# Patient Record
Sex: Male | Born: 1940 | Race: Black or African American | Hispanic: No | Smoking: Former smoker
Health system: Southern US, Community
[De-identification: ages and names within clinical notes are randomized; demographics above are authoritative.]

## PROBLEM LIST (undated history)

## (undated) DIAGNOSIS — I50810 Right heart failure, unspecified: Secondary | ICD-10-CM

## (undated) DIAGNOSIS — I272 Pulmonary hypertension, unspecified: Secondary | ICD-10-CM

## (undated) DIAGNOSIS — E785 Hyperlipidemia, unspecified: Secondary | ICD-10-CM

## (undated) DIAGNOSIS — I10 Essential (primary) hypertension: Secondary | ICD-10-CM

## (undated) DIAGNOSIS — E119 Type 2 diabetes mellitus without complications: Secondary | ICD-10-CM

## (undated) DIAGNOSIS — I2510 Atherosclerotic heart disease of native coronary artery without angina pectoris: Secondary | ICD-10-CM

## (undated) DIAGNOSIS — I4891 Unspecified atrial fibrillation: Secondary | ICD-10-CM

## (undated) DIAGNOSIS — Z951 Presence of aortocoronary bypass graft: Secondary | ICD-10-CM

---

## 2006-09-03 HISTORY — PX: CORONARY ARTERY BYPASS GRAFT: SHX141

## 2017-04-17 ENCOUNTER — Encounter (HOSPITAL_COMMUNITY): Payer: Self-pay | Admitting: Cardiology

## 2017-04-17 ENCOUNTER — Inpatient Hospital Stay (HOSPITAL_COMMUNITY)
Admission: AD | Admit: 2017-04-17 | Discharge: 2017-05-04 | DRG: 291 | Disposition: E | Payer: Medicare Other | Source: Other Acute Inpatient Hospital | Attending: Internal Medicine | Admitting: Internal Medicine

## 2017-04-17 ENCOUNTER — Inpatient Hospital Stay (HOSPITAL_COMMUNITY): Payer: Medicare Other

## 2017-04-17 DIAGNOSIS — N179 Acute kidney failure, unspecified: Secondary | ICD-10-CM | POA: Diagnosis present

## 2017-04-17 DIAGNOSIS — I2510 Atherosclerotic heart disease of native coronary artery without angina pectoris: Secondary | ICD-10-CM | POA: Diagnosis present

## 2017-04-17 DIAGNOSIS — Z87891 Personal history of nicotine dependence: Secondary | ICD-10-CM

## 2017-04-17 DIAGNOSIS — E119 Type 2 diabetes mellitus without complications: Secondary | ICD-10-CM

## 2017-04-17 DIAGNOSIS — D72829 Elevated white blood cell count, unspecified: Secondary | ICD-10-CM | POA: Diagnosis not present

## 2017-04-17 DIAGNOSIS — I472 Ventricular tachycardia: Secondary | ICD-10-CM | POA: Diagnosis present

## 2017-04-17 DIAGNOSIS — I482 Chronic atrial fibrillation: Secondary | ICD-10-CM | POA: Diagnosis present

## 2017-04-17 DIAGNOSIS — Z951 Presence of aortocoronary bypass graft: Secondary | ICD-10-CM

## 2017-04-17 DIAGNOSIS — G9340 Encephalopathy, unspecified: Secondary | ICD-10-CM | POA: Diagnosis not present

## 2017-04-17 DIAGNOSIS — E874 Mixed disorder of acid-base balance: Secondary | ICD-10-CM | POA: Diagnosis present

## 2017-04-17 DIAGNOSIS — D689 Coagulation defect, unspecified: Secondary | ICD-10-CM | POA: Diagnosis present

## 2017-04-17 DIAGNOSIS — E662 Morbid (severe) obesity with alveolar hypoventilation: Secondary | ICD-10-CM | POA: Diagnosis present

## 2017-04-17 DIAGNOSIS — I469 Cardiac arrest, cause unspecified: Secondary | ICD-10-CM | POA: Diagnosis not present

## 2017-04-17 DIAGNOSIS — J9601 Acute respiratory failure with hypoxia: Secondary | ICD-10-CM | POA: Diagnosis not present

## 2017-04-17 DIAGNOSIS — D6832 Hemorrhagic disorder due to extrinsic circulating anticoagulants: Secondary | ICD-10-CM | POA: Diagnosis present

## 2017-04-17 DIAGNOSIS — T45515A Adverse effect of anticoagulants, initial encounter: Secondary | ICD-10-CM | POA: Diagnosis present

## 2017-04-17 DIAGNOSIS — I5033 Acute on chronic diastolic (congestive) heart failure: Secondary | ICD-10-CM | POA: Diagnosis present

## 2017-04-17 DIAGNOSIS — I2609 Other pulmonary embolism with acute cor pulmonale: Secondary | ICD-10-CM | POA: Diagnosis not present

## 2017-04-17 DIAGNOSIS — I2781 Cor pulmonale (chronic): Secondary | ICD-10-CM | POA: Diagnosis present

## 2017-04-17 DIAGNOSIS — J9622 Acute and chronic respiratory failure with hypercapnia: Secondary | ICD-10-CM | POA: Diagnosis present

## 2017-04-17 DIAGNOSIS — D638 Anemia in other chronic diseases classified elsewhere: Secondary | ICD-10-CM | POA: Diagnosis present

## 2017-04-17 DIAGNOSIS — Z885 Allergy status to narcotic agent status: Secondary | ICD-10-CM | POA: Diagnosis not present

## 2017-04-17 DIAGNOSIS — N19 Unspecified kidney failure: Secondary | ICD-10-CM

## 2017-04-17 DIAGNOSIS — Z01818 Encounter for other preprocedural examination: Secondary | ICD-10-CM

## 2017-04-17 DIAGNOSIS — I132 Hypertensive heart and chronic kidney disease with heart failure and with stage 5 chronic kidney disease, or end stage renal disease: Secondary | ICD-10-CM | POA: Diagnosis present

## 2017-04-17 DIAGNOSIS — N186 End stage renal disease: Secondary | ICD-10-CM | POA: Diagnosis present

## 2017-04-17 DIAGNOSIS — E875 Hyperkalemia: Secondary | ICD-10-CM | POA: Diagnosis present

## 2017-04-17 DIAGNOSIS — I480 Paroxysmal atrial fibrillation: Secondary | ICD-10-CM | POA: Diagnosis present

## 2017-04-17 DIAGNOSIS — I509 Heart failure, unspecified: Secondary | ICD-10-CM | POA: Diagnosis present

## 2017-04-17 DIAGNOSIS — Z452 Encounter for adjustment and management of vascular access device: Secondary | ICD-10-CM

## 2017-04-17 DIAGNOSIS — E1122 Type 2 diabetes mellitus with diabetic chronic kidney disease: Secondary | ICD-10-CM | POA: Diagnosis present

## 2017-04-17 DIAGNOSIS — Z8249 Family history of ischemic heart disease and other diseases of the circulatory system: Secondary | ICD-10-CM

## 2017-04-17 DIAGNOSIS — Z7901 Long term (current) use of anticoagulants: Secondary | ICD-10-CM

## 2017-04-17 DIAGNOSIS — Z6841 Body Mass Index (BMI) 40.0 and over, adult: Secondary | ICD-10-CM

## 2017-04-17 DIAGNOSIS — I2729 Other secondary pulmonary hypertension: Secondary | ICD-10-CM | POA: Diagnosis present

## 2017-04-17 DIAGNOSIS — J9621 Acute and chronic respiratory failure with hypoxia: Secondary | ICD-10-CM | POA: Diagnosis present

## 2017-04-17 DIAGNOSIS — I50810 Right heart failure, unspecified: Secondary | ICD-10-CM | POA: Diagnosis present

## 2017-04-17 DIAGNOSIS — D696 Thrombocytopenia, unspecified: Secondary | ICD-10-CM | POA: Diagnosis present

## 2017-04-17 DIAGNOSIS — E871 Hypo-osmolality and hyponatremia: Secondary | ICD-10-CM | POA: Diagnosis not present

## 2017-04-17 DIAGNOSIS — Z515 Encounter for palliative care: Secondary | ICD-10-CM | POA: Diagnosis present

## 2017-04-17 DIAGNOSIS — R570 Cardiogenic shock: Secondary | ICD-10-CM | POA: Diagnosis not present

## 2017-04-17 DIAGNOSIS — T80219A Unspecified infection due to central venous catheter, initial encounter: Secondary | ICD-10-CM

## 2017-04-17 DIAGNOSIS — I4891 Unspecified atrial fibrillation: Secondary | ICD-10-CM | POA: Diagnosis present

## 2017-04-17 DIAGNOSIS — E785 Hyperlipidemia, unspecified: Secondary | ICD-10-CM | POA: Diagnosis present

## 2017-04-17 DIAGNOSIS — I361 Nonrheumatic tricuspid (valve) insufficiency: Secondary | ICD-10-CM | POA: Diagnosis not present

## 2017-04-17 DIAGNOSIS — I5084 End stage heart failure: Secondary | ICD-10-CM | POA: Diagnosis present

## 2017-04-17 DIAGNOSIS — Z9981 Dependence on supplemental oxygen: Secondary | ICD-10-CM

## 2017-04-17 DIAGNOSIS — I272 Pulmonary hypertension, unspecified: Secondary | ICD-10-CM | POA: Diagnosis not present

## 2017-04-17 DIAGNOSIS — J449 Chronic obstructive pulmonary disease, unspecified: Secondary | ICD-10-CM | POA: Diagnosis present

## 2017-04-17 DIAGNOSIS — I10 Essential (primary) hypertension: Secondary | ICD-10-CM | POA: Diagnosis present

## 2017-04-17 DIAGNOSIS — I50813 Acute on chronic right heart failure: Secondary | ICD-10-CM | POA: Diagnosis not present

## 2017-04-17 DIAGNOSIS — Z978 Presence of other specified devices: Secondary | ICD-10-CM

## 2017-04-17 HISTORY — DX: Atherosclerotic heart disease of native coronary artery without angina pectoris: I25.10

## 2017-04-17 HISTORY — DX: Presence of aortocoronary bypass graft: Z95.1

## 2017-04-17 HISTORY — DX: Right heart failure, unspecified: I50.810

## 2017-04-17 HISTORY — DX: Hyperlipidemia, unspecified: E78.5

## 2017-04-17 HISTORY — DX: Pulmonary hypertension, unspecified: I27.20

## 2017-04-17 HISTORY — DX: Essential (primary) hypertension: I10

## 2017-04-17 HISTORY — DX: Type 2 diabetes mellitus without complications: E11.9

## 2017-04-17 HISTORY — DX: Unspecified atrial fibrillation: I48.91

## 2017-04-17 LAB — CBC WITH DIFFERENTIAL/PLATELET
BASOS ABS: 0 10*3/uL (ref 0.0–0.1)
BASOS PCT: 0 %
EOS ABS: 0 10*3/uL (ref 0.0–0.7)
EOS PCT: 0 %
HCT: 32.2 % — ABNORMAL LOW (ref 39.0–52.0)
Hemoglobin: 10.3 g/dL — ABNORMAL LOW (ref 13.0–17.0)
Lymphocytes Relative: 13 %
Lymphs Abs: 1.1 10*3/uL (ref 0.7–4.0)
MCH: 27.2 pg (ref 26.0–34.0)
MCHC: 32 g/dL (ref 30.0–36.0)
MCV: 85 fL (ref 78.0–100.0)
MONO ABS: 0.5 10*3/uL (ref 0.1–1.0)
Monocytes Relative: 6 %
Neutro Abs: 6.8 10*3/uL (ref 1.7–7.7)
Neutrophils Relative %: 81 %
PLATELETS: 131 10*3/uL — AB (ref 150–400)
RBC: 3.79 MIL/uL — AB (ref 4.22–5.81)
RDW: 17.1 % — AB (ref 11.5–15.5)
WBC: 8.4 10*3/uL (ref 4.0–10.5)

## 2017-04-17 LAB — POCT I-STAT 3, ART BLOOD GAS (G3+)
ACID-BASE DEFICIT: 7 mmol/L — AB (ref 0.0–2.0)
Bicarbonate: 22.4 mmol/L (ref 20.0–28.0)
O2 SAT: 87 %
Patient temperature: 98.6
TCO2: 24 mmol/L (ref 0–100)
pCO2 arterial: 61.9 mmHg — ABNORMAL HIGH (ref 32.0–48.0)
pH, Arterial: 7.166 — CL (ref 7.350–7.450)
pO2, Arterial: 68 mmHg — ABNORMAL LOW (ref 83.0–108.0)

## 2017-04-17 MED ORDER — ATORVASTATIN CALCIUM 20 MG PO TABS: 20.0000 mg | ORAL_TABLET | Freq: Every day | ORAL | Status: DC

## 2017-04-17 MED ORDER — METOPROLOL SUCCINATE ER 50 MG PO TB24: 50.0000 mg | ORAL_TABLET | Freq: Every day | ORAL | Status: DC

## 2017-04-17 MED ORDER — ONDANSETRON HCL 4 MG/2ML IJ SOLN: 4.0000 mg | Freq: Four times a day (QID) | INTRAMUSCULAR | Status: DC | PRN

## 2017-04-17 MED ORDER — ACETAMINOPHEN 325 MG PO TABS: 650.0000 mg | ORAL_TABLET | ORAL | Status: DC | PRN

## 2017-04-17 MED ORDER — INSULIN ASPART 100 UNIT/ML ~~LOC~~ SOLN: 0.0000 [IU] | Freq: Every day | SUBCUTANEOUS | Status: DC

## 2017-04-17 MED ORDER — INSULIN ASPART 100 UNIT/ML ~~LOC~~ SOLN: 0.0000 [IU] | Freq: Three times a day (TID) | SUBCUTANEOUS | Status: DC

## 2017-04-17 MED ORDER — SODIUM CHLORIDE 0.9 % IV SOLN: 250.0000 mL | INTRAVENOUS | Status: DC | PRN

## 2017-04-17 MED ORDER — DOCUSATE SODIUM 100 MG PO CAPS: 100.0000 mg | ORAL_CAPSULE | Freq: Two times a day (BID) | ORAL | Status: DC | PRN

## 2017-04-17 MED ORDER — SODIUM CHLORIDE 0.9% FLUSH
3.0000 mL | Freq: Two times a day (BID) | INTRAVENOUS | Status: DC
  Administered 2017-04-17 – 2017-04-20 (×6): 3 mL via INTRAVENOUS
  Administered 2017-04-20: 10 mL via INTRAVENOUS
  Administered 2017-04-21: 3 mL via INTRAVENOUS

## 2017-04-17 MED ORDER — SODIUM CHLORIDE 0.9 % IV SOLN
250.0000 mL | INTRAVENOUS | Status: DC | PRN
  Administered 2017-04-18: 10 mL/h via INTRAVENOUS

## 2017-04-17 MED ORDER — INSULIN ASPART 100 UNIT/ML ~~LOC~~ SOLN
2.0000 [IU] | SUBCUTANEOUS | Status: DC
  Administered 2017-04-18: 6 [IU] via SUBCUTANEOUS
  Administered 2017-04-18 (×2): 4 [IU] via SUBCUTANEOUS
  Administered 2017-04-18 – 2017-04-19 (×3): 2 [IU] via SUBCUTANEOUS
  Administered 2017-04-19: 4 [IU] via SUBCUTANEOUS
  Administered 2017-04-19: 2 [IU] via SUBCUTANEOUS
  Administered 2017-04-19: 4 [IU] via SUBCUTANEOUS
  Administered 2017-04-20 (×2): 2 [IU] via SUBCUTANEOUS
  Administered 2017-04-20 (×3): 4 [IU] via SUBCUTANEOUS
  Administered 2017-04-20 – 2017-04-21 (×2): 2 [IU] via SUBCUTANEOUS
  Administered 2017-04-21 (×3): 4 [IU] via SUBCUTANEOUS
  Administered 2017-04-21 – 2017-04-22 (×4): 2 [IU] via SUBCUTANEOUS

## 2017-04-17 MED ORDER — NOREPINEPHRINE BITARTRATE 1 MG/ML IV SOLN
0.0000 ug/min | INTRAVENOUS | Status: DC
  Administered 2017-04-18: 11 ug/min via INTRAVENOUS
  Administered 2017-04-18: 15 ug/min via INTRAVENOUS
  Filled 2017-04-17 (×3): qty 4

## 2017-04-17 MED ORDER — SODIUM CHLORIDE 0.9% FLUSH
3.0000 mL | INTRAVENOUS | Status: DC | PRN
  Administered 2017-04-18: 3 mL via INTRAVENOUS
  Filled 2017-04-17: qty 3

## 2017-04-17 MED ORDER — PANTOPRAZOLE SODIUM 40 MG IV SOLR: 40.0000 mg | INTRAVENOUS | Status: DC

## 2017-04-17 MED ORDER — KETAMINE HCL-SODIUM CHLORIDE 100-0.9 MG/10ML-% IV SOSY
100.0000 mg | PREFILLED_SYRINGE | Freq: Once | INTRAVENOUS | Status: AC
  Administered 2017-04-18: 100 mg via INTRAVENOUS
  Filled 2017-04-17: qty 10

## 2017-04-17 MED ORDER — CALCITRIOL 0.25 MCG PO CAPS: 0.2500 ug | ORAL_CAPSULE | Freq: Every day | ORAL | Status: DC

## 2017-04-17 NOTE — Progress Notes (Signed)
[facility] for heart failure and kidney failure management. Cardiology team is notified. Dr Mayford Knifeurner is on call. Patient is fully oriented, vital signs are within expected range.

## 2017-04-17 NOTE — H&P (Signed)
History and Physical   Patient ID: William Strong, MRN: [id number], DOB: [birth year]   Date of Encounter: 04/16/2017, 9:59 PM  Primary Care Provider: No primary care provider on file. Cardiologist: [place]  Electrophysiologist:  NA  Chief Complaint:  R-sided HF, pulm HTN  History of Present Illness: William Strong is a 76 y.o. male w/ h/o CAD s/p CABG in 2008 at [facility] (LIMA-LAD known to be atretic, SVG-OM1, SVG-PDA), R-sided HF w/ preserved LVEF, pulm HTN (severe on RHC Jan 2018), chronic renal failure, parox afib, HTN, dyslipidemia, DM2 transferred from [place] for worsening HF/renal failure. History is spotty from [place], but as best I can tell, pt was admitted from clinic w/ c/o worsening dyspnea, volume overload. He has R-sided HF w/ preserved LVEF. He was aggressively diuresed in [place] on bumex gtt, and was on milrinone in effort to treat his RV systolic dysfunction, but continued to deteriorate clinically w/ worsening renal failure as well as OHS and progressive hypercapnea and respiratory acidosis. Transferred here for further management Pt is currently on NRB mask. He is in mild resp distress.  Past Medical History:  Diagnosis Date  . A-fib ([place])   . CAD (coronary artery disease)   . Diabetes ([place])   . Dyslipidemia   . HTN (hypertension)   . Hx of CABG   . Pulmonary HTN ([place])   . Right-sided heart failure [facility])     Past Surgical History:  Procedure Laterality Date  . CORONARY ARTERY BYPASS GRAFT  2008   [facility]     Prior to Admission medications   Not on File     Allergies: Allergies  Allergen Reactions  . Oxycontin [Oxycodone Hcl] Shortness Of Breath and Nausea And Vomiting    Reported by patient    Social History:  The patient  reports that he quit smoking about 6 months ago. He does not have any smokeless tobacco history on file. He reports that he drinks alcohol. He reports that he does not use drugs.   Family History:  The patient's family history includes  CAD in his mother; Hypertension in his mother.   ROS:  Please see the history of present illness.     All other systems reviewed and negative.   Vital Signs: There were no vitals taken for this visit.  BP 125/76, HR 96, O2 sat 95% on NRB  PHYSICAL EXAM: General:  Well nourished, well developed, mild resp distress on NRB HEENT: normal Lymph: no adenopathy Neck: neck is thick but JVP appears elevated Endocrine:  No thryomegaly Vascular: No carotid bruits; FA pulses 2+ bilaterally without bruits  Cardiac:  HS obscured by respiratory. No obvious murmur Lungs:  Course BS bilaterally anteriorly Abd: soft, nontender, no hepatomegaly  Ext: warm, well-perfused. 2-3+ bilateral edema Musculoskeletal:  No deformities, BUE and BLE strength normal and equal Skin: warm and dry  Neuro:  CNs 2-12 intact, no focal abnormalities noted Psych:  Normal affect   EKG:  04-15-17 NSR with RBBB, w/ frequent PACs vs afib, difficult to tell. Poor tracing  Labs:  No results found for: WBC, HGB, HCT, MCV, PLT No results for input(s): NA, K, CL, CO2, BUN, CREATININE, CALCIUM, PROT, BILITOT, ALKPHOS, ALT, AST, GLUCOSE in the last 168 hours.  Invalid input(s): LABALBU No results for input(s): CKTOTAL, CKMB, TROPONINI in the last 72 hours. Troponin (Point of Care Test) No results for input(s): TROPIPOC in the last 72 hours.  No results found for: CHOL, HDL, LDLCALC, TRIG No results found for: DDIMER  Radiology/Studies:  No results found.   Labs from [place]: 04/16/2017 138/5.6/104.5/19/100/ Cr 5.2/119 INR 3.23 ABG pH 7.198 pCO2 62, HCO3 24.1, pO2 130.4    TTE 04-16-17 Mildly dilated LV w/o LVH, EF est 55-60%, mildly dilated RV w/ severe RV systolic dysfunction, mildly dilated LA, severely dilated RA, mild MAC w/ mild MR, mild TR, mild pulm HTN w/ est PAS ~22mHg  Nuc stress 09-05-16 showed no significant ischemia; est LVEF 47%  RHC 09-24-16 RA 25/32 mean 246mg RV 8023m w/ EDP 42m94mPA 80/35 with mean  52mm61mCW 11/15 with mean of 10mmH36m 6.5 L/min, CI 2.5, PVR 6.1 Wood units  LHC 01-30-12 LM nl, 50% mLAD, CTO ostial LCx, 80% mRCA Grafts: LIMA-LAD atrentic distally, SVG-OM1 patent, filling OM2 retrograde with ~95% in OM2 (small vessel not amenable for intervention), SVG-PDA patent with LI  ASSESSMENT AND PLAN:   1. R-sided HF/pulm HTN: pt has severe RV dysfunction on recent TTE w/ preserved LVEF. He has not tolerated aggressive diuresis w/ bumex gtt, now w/ progressively worsening renal dysfunction. He did not improve w/ attempt at inotrope gtt. CVVHD is planned to be initiated in the AM. Would hold off on further diuresis via IV for now.  2. CAD: h/o CABG. There do not appear to be any acute issues in regard to active CAD or ischemia at this time. LVEF is preserved  3. Renal dysfunction: worsening; renal to consult tomorrow and initiate CVVHD per notes from OSH.   3. OHS/pulm HTN: will consult CCM for further management of respiratory status  4. Afib: possible afib at OSH. Will get EKG here  Will get basic labs, ABG, EKG. Consult CCM and nephrology. HF svc to see in the AM  Thank you for the opportunity to participate in the care of this very pleasant patient. Will follow. Please call w/ questions.   StephaRudean Curt FACC 0[facility]/18 11:11 PM

## 2017-04-17 NOTE — Consult Note (Signed)
PULMONARY / CRITICAL CARE MEDICINE   Name: William Strong MRN: [id number] DOB: [birth year]    ADMISSION DATE:  05/02/2017 CONSULTATION DATE:  04/12/2017  REFERRING MD:  Dr. Gala Romney   CHIEF COMPLAINT:  Respiratory Distress   HISTORY OF PRESENT ILLNESS:   76 year old male with PMH of A.Fib (on coumadin), OSA on CPAP at HS, Chronic Hypoxia on 4L home oxygen, CKD, CAD s/p CABG, DM, Dyslipidemia, HTN, Pulmonary HTN, and Right-sided heart failure.   Presents to OSH 3 days ago with complainants of progressive dyspnea. Throughout course patient was aggressively diuresed on Bumex gtt and milrinone. However, patient continued to deteriorate with increasing creatinine and BUN and progressive hypercapnia and respiratory acidosis. Was transferred to [facility] for further management. PCCM asked to consult for medial management.   PAST MEDICAL HISTORY :  He  has a past medical history of A-fib (HCC); CAD (coronary artery disease); Diabetes (HCC); Dyslipidemia; HTN (hypertension); CABG; Pulmonary HTN (HCC); and Right-sided heart failure (HCC).  PAST SURGICAL HISTORY: He  has a past surgical history that includes Coronary artery bypass graft (2008).  Allergies  Allergen Reactions  . Oxycontin [Oxycodone Hcl] Shortness Of Breath and Nausea And Vomiting    Reported by patient    No current facility-administered medications on file prior to encounter.    No current outpatient prescriptions on file prior to encounter.    FAMILY HISTORY:  His indicated that his mother is deceased.    SOCIAL HISTORY: He  reports that he quit smoking about 7 months ago. He does not have any smokeless tobacco history on file. He reports that he drinks alcohol. He reports that he does not use drugs.  REVIEW OF SYSTEMS:   Unable to review as patient is encephalopathic   SUBJECTIVE:    VITAL SIGNS: Wt (!) 154.2 kg (340 lb)   HEMODYNAMICS:    VENTILATOR SETTINGS:    INTAKE / OUTPUT: No intake/output data  recorded.  PHYSICAL EXAMINATION: General:  Critical ill male in respiratory distress  Neuro:  Lethargic, responds to physical stimulation, moves extremities   HEENT:  Dry MM  Cardiovascular:  Irregular, no MRG  Lungs:  Crackles to bases, no wheeze, labored  Abdomen:  Obese, active bowel sounds  Musculoskeletal:  +2 BLE edema  Skin:  Warm, dry, intact   LABS:  BMET  Recent Labs Lab 04/25/2017 2322  NA 140  K 6.0*  CL 108  CO2 22  BUN 117*  CREATININE 6.23*  GLUCOSE 149*    Electrolytes  Recent Labs Lab 04/30/2017 2322  CALCIUM 7.1*    CBC  Recent Labs Lab 04/05/2017 2322  WBC 8.4  HGB 10.3*  HCT 32.2*  PLT 131*    Coag's  Recent Labs Lab 04/13/2017 2322  APTT 45*  INR 2.98    Sepsis Markers  Recent Labs Lab 04/16/2017 2322  LATICACIDVEN 0.8    ABG  Recent Labs Lab 04/20/2017 2354  PHART 7.166*  PCO2ART 61.9*  PO2ART 68.0*    Liver Enzymes  Recent Labs Lab 04/19/2017 2322  AST 16  ALT 11*  ALKPHOS 84  BILITOT 0.9  ALBUMIN 3.4*    Cardiac Enzymes No results for input(s): TROPONINI, PROBNP in the last 168 hours.  Glucose No results for input(s): GLUCAP in the last 168 hours.  Imaging Dg Chest Port 1 View  Result Date: 04/25/2017 CLINICAL DATA:  Acute onset of respiratory failure. Hypoxia. Initial encounter. EXAM: PORTABLE CHEST 1 VIEW COMPARISON:  None. FINDINGS: The lungs are well-aerated.  Vascular congestion is noted. Diffusely increased interstitial markings raise concern for pulmonary edema. A small right pleural effusion is suspected. No pneumothorax is seen. The cardiomediastinal silhouette is mildly enlarged. The patient is status post median sternotomy. Evaluation is somewhat suboptimal due to patient rotation. No acute osseous abnormalities are seen. IMPRESSION: Vascular congestion and mild cardiomegaly. Suspect small right pleural effusion. Diffusely increased interstitial markings raise concern for pulmonary edema. Electronically  Signed   By: Roanna RaiderJeffery  Chang M.D.   On: 2016-10-15 23:47     STUDIES:  CXR 8/16 > Vascular congestion and mild cardiomegaly. Suspect small right pleural effusion. Diffusely increased interstitial markings raise concern for pulmonary edema.  CULTURES: None.   ANTIBIOTICS: None.   SIGNIFICANT EVENTS: 8/15 > Presented from OSH   LINES/TUBES: ETT 8/16 >>   DISCUSSION: 76 year old male presents from OSH with progressive right side heart failure complicated by CKD. [facility]   ASSESSMENT / PLAN:  PULMONARY A: Acute on Chronic Hypoxic Respiratory Failure (On home 4L Oxygen) in setting of pulmonary edema  Pulmonary HTN H/O OSA on CPAP  P:   Maintain SPO2 > 92 ABG/CXR now  Pulmonary Hygiene  Intubate  Vent Support  Pulmonary Hygiene  Sildenafil 20 mg q6h  CARDIOVASCULAR A:  Acute on Chronic Diastolic HF  H/O CAD s/p CABG, HTN, HLD  P:  Per Cardiology > Plans to Cath  Cardiac Monitoring  Maintain MAP >65  Hold home HTN medications  Hold Heparin Gtt as INR is >3  RENAL A:   Acute on Chronic Kidney Disease Stage IV Hyperkalemia   - 6.0 >  H/O Urinary Retention  P:   Trend BMP Nephrology consulted and will see in AM  Replace electrolytes as indicated  Kayexalate and Temporization now   GASTROINTESTINAL A:   No issues  P:   NPO PPI   HEMATOLOGIC A:   Anemia of Chronic disease  On Chronic Anticoagulation given PAF Increased INR  P:  Trend CBC  SCDS Maintain Hemoglobin > 7  Plans for Heparin as above   INFECTIOUS A:   No issues  P:   Trend WBC and Fever Curve   ENDOCRINE A:   DM   P:   Trend Glucose  SSI  NEUROLOGIC A:   Acute Encephalopathy Secondary to sedation  P:   RASS goal: -1/-2 Wean Propofol to achieve RASS  Fentanyl PRN     FAMILY  - Updates: Family updated at bedside   - Inter-disciplinary family meet or Palliative Care meeting due by:  04/25/2017    CC Time: 54 minutes   Jovita KussmaulKatalina  Eubanks, AGACNP-BC [facility]  Pgr: [phone]  PCCM Pgr: [phone]   Patient seen and examined with Mrs. Janyth Contesubanks. I agree with the assessment and plan.  76 with Hx of pulmonary hypertension and RV failure presented with progressive dyspnea due to decompensated heart failure and AKI. Patient found to have resp and metabolic acidosis, required intubation.  Acute hypoxic resp failure due to volume overload. On mechanical ventilation. Follow ABGs, High PEEP for hypoxia  Shock: cardiogenic vs septic. Invasive hemodynamic monitor. PAC insertion. Send cultures and start broad abx. Sildenafil for pulmonary hypertension  AKI and hyperkalemia: treat hyperkalemia medically, start diuresis. Consult nephrology  Oswaldo MilianAkram Lilybelle Mayeda, MD CCM attending'

## 2017-04-18 ENCOUNTER — Inpatient Hospital Stay (HOSPITAL_COMMUNITY): Payer: Medicare Other

## 2017-04-18 DIAGNOSIS — J9601 Acute respiratory failure with hypoxia: Secondary | ICD-10-CM

## 2017-04-18 DIAGNOSIS — M7989 Other specified soft tissue disorders: Secondary | ICD-10-CM

## 2017-04-18 DIAGNOSIS — N179 Acute kidney failure, unspecified: Secondary | ICD-10-CM

## 2017-04-18 DIAGNOSIS — I361 Nonrheumatic tricuspid (valve) insufficiency: Secondary | ICD-10-CM

## 2017-04-18 LAB — POCT I-STAT 4, (NA,K, GLUC, HGB,HCT)
GLUCOSE: 160 mg/dL — AB (ref 65–99)
HEMATOCRIT: 30 % — AB (ref 39.0–52.0)
HEMOGLOBIN: 10.2 g/dL — AB (ref 13.0–17.0)
Potassium: 5.6 mmol/L — ABNORMAL HIGH (ref 3.5–5.1)
Sodium: 141 mmol/L (ref 135–145)

## 2017-04-18 LAB — TROPONIN I
Troponin I: 0.05 ng/mL (ref <0.03)
Troponin I: 0.06 ng/mL (ref <0.03)

## 2017-04-18 LAB — BLOOD GAS, ARTERIAL
ACID-BASE DEFICIT: 6.6 mmol/L — AB (ref 0.0–2.0)
BICARBONATE: 20.6 mmol/L (ref 20.0–28.0)
Drawn by: 418751
FIO2: 100
LHR: 22 {breaths}/min
O2 Saturation: 91.9 %
PCO2 ART: 58.8 mmHg — AB (ref 32.0–48.0)
PEEP/CPAP: 14 cmH2O
PH ART: 7.17 — AB (ref 7.350–7.450)
PRESSURE CONTROL: 20 cmH2O
Patient temperature: 98.6
pO2, Arterial: 76.4 mmHg — ABNORMAL LOW (ref 83.0–108.0)

## 2017-04-18 LAB — BASIC METABOLIC PANEL
ANION GAP: 11 (ref 5–15)
ANION GAP: 11 (ref 5–15)
Anion gap: 9 (ref 5–15)
BUN: 116 mg/dL — AB (ref 6–20)
BUN: 116 mg/dL — AB (ref 6–20)
BUN: 115 mg/dL — ABNORMAL HIGH (ref 6–20)
CALCIUM: 7.1 mg/dL — AB (ref 8.9–10.3)
CALCIUM: 7.2 mg/dL — AB (ref 8.9–10.3)
CHLORIDE: 108 mmol/L (ref 101–111)
CHLORIDE: 107 mmol/L (ref 101–111)
CO2: 23 mmol/L (ref 22–32)
CO2: 21 mmol/L — ABNORMAL LOW (ref 22–32)
CO2: 21 mmol/L — ABNORMAL LOW (ref 22–32)
CREATININE: 6.22 mg/dL — AB (ref 0.61–1.24)
CREATININE: 6.2 mg/dL — AB (ref 0.61–1.24)
Calcium: 7 mg/dL — ABNORMAL LOW (ref 8.9–10.3)
Chloride: 107 mmol/L (ref 101–111)
Creatinine, Ser: 6.13 mg/dL — ABNORMAL HIGH (ref 0.61–1.24)
GFR calc Af Amer: 9 mL/min — ABNORMAL LOW (ref >60)
GFR calc Af Amer: 9 mL/min — ABNORMAL LOW (ref >60)
GFR calc non Af Amer: 8 mL/min — ABNORMAL LOW (ref >60)
GFR calc non Af Amer: 8 mL/min — ABNORMAL LOW (ref >60)
GFR, EST AFRICAN AMERICAN: 9 mL/min — AB (ref >60)
GFR, EST NON AFRICAN AMERICAN: 8 mL/min — AB (ref >60)
GLUCOSE: 188 mg/dL — AB (ref 65–99)
GLUCOSE: 172 mg/dL — AB (ref 65–99)
GLUCOSE: 151 mg/dL — AB (ref 65–99)
POTASSIUM: 5.8 mmol/L — AB (ref 3.5–5.1)
Potassium: 6.1 mmol/L — ABNORMAL HIGH (ref 3.5–5.1)
Potassium: 5.6 mmol/L — ABNORMAL HIGH (ref 3.5–5.1)
Sodium: 140 mmol/L (ref 135–145)
Sodium: 139 mmol/L (ref 135–145)
Sodium: 139 mmol/L (ref 135–145)

## 2017-04-18 LAB — COMPREHENSIVE METABOLIC PANEL
ALT: 11 U/L — AB (ref 17–63)
AST: 16 U/L (ref 15–41)
Albumin: 3.4 g/dL — ABNORMAL LOW (ref 3.5–5.0)
Alkaline Phosphatase: 84 U/L (ref 38–126)
Anion gap: 10 (ref 5–15)
BUN: 117 mg/dL — AB (ref 6–20)
CHLORIDE: 108 mmol/L (ref 101–111)
CO2: 22 mmol/L (ref 22–32)
CREATININE: 6.23 mg/dL — AB (ref 0.61–1.24)
Calcium: 7.1 mg/dL — ABNORMAL LOW (ref 8.9–10.3)
GFR calc Af Amer: 9 mL/min — ABNORMAL LOW (ref >60)
GFR, EST NON AFRICAN AMERICAN: 8 mL/min — AB (ref >60)
Glucose, Bld: 149 mg/dL — ABNORMAL HIGH (ref 65–99)
POTASSIUM: 6 mmol/L — AB (ref 3.5–5.1)
SODIUM: 140 mmol/L (ref 135–145)
Total Bilirubin: 0.9 mg/dL (ref 0.3–1.2)
Total Protein: 6.6 g/dL (ref 6.5–8.1)

## 2017-04-18 LAB — POCT I-STAT 3, ART BLOOD GAS (G3+)
ACID-BASE DEFICIT: 4 mmol/L — AB (ref 0.0–2.0)
Acid-base deficit: 4 mmol/L — ABNORMAL HIGH (ref 0.0–2.0)
BICARBONATE: 21.5 mmol/L (ref 20.0–28.0)
BICARBONATE: 21.4 mmol/L (ref 20.0–28.0)
O2 SAT: 94 %
O2 Saturation: 91 %
PCO2 ART: 40.1 mmHg (ref 32.0–48.0)
PO2 ART: 69 mmHg — AB (ref 83.0–108.0)
TCO2: 23 mmol/L (ref 0–100)
TCO2: 23 mmol/L (ref 0–100)
pCO2 arterial: 37.9 mmHg (ref 32.0–48.0)
pH, Arterial: 7.338 — ABNORMAL LOW (ref 7.350–7.450)
pH, Arterial: 7.355 (ref 7.350–7.450)
pO2, Arterial: 65 mmHg — ABNORMAL LOW (ref 83.0–108.0)

## 2017-04-18 LAB — PROTIME-INR
INR: 2.98
PROTHROMBIN TIME: 31.6 s — AB (ref 11.4–15.2)

## 2017-04-18 LAB — COOXEMETRY PANEL
Carboxyhemoglobin: 1 % (ref 0.5–1.5)
METHEMOGLOBIN: 1.5 % (ref 0.0–1.5)
O2 Saturation: 66 %
TOTAL HEMOGLOBIN: 12 g/dL (ref 12.0–16.0)

## 2017-04-18 LAB — GLUCOSE, CAPILLARY
GLUCOSE-CAPILLARY: 212 mg/dL — AB (ref 65–99)
GLUCOSE-CAPILLARY: 181 mg/dL — AB (ref 65–99)
Glucose-Capillary: 129 mg/dL — ABNORMAL HIGH (ref 65–99)
Glucose-Capillary: 91 mg/dL (ref 65–99)
Glucose-Capillary: 104 mg/dL — ABNORMAL HIGH (ref 65–99)
Glucose-Capillary: 162 mg/dL — ABNORMAL HIGH (ref 65–99)

## 2017-04-18 LAB — LACTIC ACID, PLASMA
LACTIC ACID, VENOUS: 0.8 mmol/L (ref 0.5–1.9)
Lactic Acid, Venous: 1 mmol/L (ref 0.5–1.9)

## 2017-04-18 LAB — NA AND K (SODIUM & POTASSIUM), RAND UR
Potassium Urine: 38 mmol/L
Sodium, Ur: 12 mmol/L

## 2017-04-18 LAB — RENAL FUNCTION PANEL
Albumin: 3.1 g/dL — ABNORMAL LOW (ref 3.5–5.0)
Anion gap: 14 (ref 5–15)
BUN: 114 mg/dL — AB (ref 6–20)
CALCIUM: 7.3 mg/dL — AB (ref 8.9–10.3)
CHLORIDE: 106 mmol/L (ref 101–111)
CO2: 20 mmol/L — AB (ref 22–32)
CREATININE: 5.93 mg/dL — AB (ref 0.61–1.24)
GFR calc Af Amer: 10 mL/min — ABNORMAL LOW (ref >60)
GFR calc non Af Amer: 8 mL/min — ABNORMAL LOW (ref >60)
Glucose, Bld: 94 mg/dL (ref 65–99)
Phosphorus: 6 mg/dL — ABNORMAL HIGH (ref 2.5–4.6)
Potassium: 4.5 mmol/L (ref 3.5–5.1)
SODIUM: 140 mmol/L (ref 135–145)

## 2017-04-18 LAB — CG4 I-STAT (LACTIC ACID): Lactic Acid, Venous: 0.55 mmol/L (ref 0.5–1.9)

## 2017-04-18 LAB — OSMOLALITY, URINE: OSMOLALITY UR: 349 mosm/kg (ref 300–900)

## 2017-04-18 LAB — BRAIN NATRIURETIC PEPTIDE: B NATRIURETIC PEPTIDE 5: 296.5 pg/mL — AB (ref 0.0–100.0)

## 2017-04-18 LAB — TRIGLYCERIDES: Triglycerides: 118 mg/dL (ref <150)

## 2017-04-18 LAB — MAGNESIUM: MAGNESIUM: 1.9 mg/dL (ref 1.7–2.4)

## 2017-04-18 LAB — MRSA PCR SCREENING: MRSA by PCR: NEGATIVE

## 2017-04-18 LAB — APTT: APTT: 45 s — AB (ref 24–36)

## 2017-04-18 LAB — CREATININE, URINE, RANDOM: CREATININE, URINE: 280.64 mg/dL

## 2017-04-18 LAB — ECHOCARDIOGRAM COMPLETE
Height: 70 in
WEIGHTICAEL: 5424 [oz_av]

## 2017-04-18 MED ORDER — SODIUM CHLORIDE 0.9 % IV SOLN
1.0000 g | Freq: Once | INTRAVENOUS | Status: AC
  Administered 2017-04-18: 1 g via INTRAVENOUS
  Filled 2017-04-18: qty 10

## 2017-04-18 MED ORDER — BISACODYL 10 MG RE SUPP
10.0000 mg | Freq: Every day | RECTAL | Status: DC | PRN
Start: 2017-04-18 — End: 2017-04-23

## 2017-04-18 MED ORDER — SODIUM POLYSTYRENE SULFONATE 15 GM/60ML PO SUSP
15.0000 g | Freq: Once | ORAL | Status: AC
Start: 2017-04-18 — End: 2017-04-18
  Administered 2017-04-18: 15 g
  Filled 2017-04-18: qty 60

## 2017-04-18 MED ORDER — ORAL CARE MOUTH RINSE
15.0000 mL | OROMUCOSAL | Status: DC
  Administered 2017-04-18 – 2017-04-22 (×41): 15 mL via OROMUCOSAL

## 2017-04-18 MED ORDER — IPRATROPIUM-ALBUTEROL 0.5-2.5 (3) MG/3ML IN SOLN
3.0000 mL | Freq: Four times a day (QID) | RESPIRATORY_TRACT | Status: DC
  Administered 2017-04-18 – 2017-04-22 (×18): 3 mL via RESPIRATORY_TRACT
  Filled 2017-04-18 (×19): qty 3

## 2017-04-18 MED ORDER — SODIUM CHLORIDE 0.9 % FOR CRRT
INTRAVENOUS_CENTRAL | Status: DC | PRN
  Filled 2017-04-18: qty 1000

## 2017-04-18 MED ORDER — MIDAZOLAM HCL 2 MG/2ML IJ SOLN
1.0000 mg | INTRAMUSCULAR | Status: DC | PRN
  Administered 2017-04-19 – 2017-04-22 (×2): 2 mg via INTRAVENOUS
  Filled 2017-04-18 (×3): qty 2
  Filled 2017-04-18: qty 4
  Filled 2017-04-18: qty 2

## 2017-04-18 MED ORDER — SODIUM CHLORIDE 0.9% FLUSH
10.0000 mL | Freq: Two times a day (BID) | INTRAVENOUS | Status: DC
  Administered 2017-04-18: 10 mL

## 2017-04-18 MED ORDER — PRISMASOL BGK 4/2.5 32-4-2.5 MEQ/L IV SOLN
INTRAVENOUS | Status: DC
  Administered 2017-04-18 – 2017-04-21 (×4): via INTRAVENOUS_CENTRAL
  Filled 2017-04-18 (×5): qty 5000

## 2017-04-18 MED ORDER — MAGNESIUM SULFATE 2 GM/50ML IV SOLN
2.0000 g | Freq: Once | INTRAVENOUS | Status: AC
  Administered 2017-04-18: 2 g via INTRAVENOUS
  Filled 2017-04-18: qty 50

## 2017-04-18 MED ORDER — VITAL HIGH PROTEIN PO LIQD
1000.0000 mL | ORAL | Status: DC
  Administered 2017-04-18: 1000 mL
  Administered 2017-04-18 – 2017-04-19 (×2)
  Administered 2017-04-19: 1000 mL
  Administered 2017-04-19 (×2)
  Administered 2017-04-20: 1000 mL
  Administered 2017-04-20 – 2017-04-21 (×3)
  Administered 2017-04-21: 1000 mL
  Administered 2017-04-21 – 2017-04-22 (×5)
  Filled 2017-04-18 (×2): qty 1000

## 2017-04-18 MED ORDER — PRISMASOL BGK 4/2.5 32-4-2.5 MEQ/L IV SOLN
INTRAVENOUS | Status: DC
  Administered 2017-04-18 – 2017-04-22 (×6): via INTRAVENOUS_CENTRAL
  Filled 2017-04-18 (×7): qty 5000

## 2017-04-18 MED ORDER — CHLORHEXIDINE GLUCONATE CLOTH 2 % EX PADS
6.0000 | MEDICATED_PAD | Freq: Every day | CUTANEOUS | Status: DC
  Administered 2017-04-18: 6 via TOPICAL

## 2017-04-18 MED ORDER — INSULIN ASPART 100 UNIT/ML ~~LOC~~ SOLN
10.0000 [IU] | Freq: Once | SUBCUTANEOUS | Status: AC
  Administered 2017-04-18: 10 [IU] via INTRAVENOUS

## 2017-04-18 MED ORDER — FENTANYL CITRATE (PF) 100 MCG/2ML IJ SOLN
25.0000 ug | INTRAMUSCULAR | Status: DC | PRN
  Administered 2017-04-18 – 2017-04-22 (×7): 100 ug via INTRAVENOUS
  Filled 2017-04-18 (×9): qty 2

## 2017-04-18 MED ORDER — PROPOFOL 1000 MG/100ML IV EMUL
0.0000 ug/kg/min | INTRAVENOUS | Status: DC
Start: 2017-04-18 — End: 2017-04-23
  Administered 2017-04-18: 40 ug/kg/min via INTRAVENOUS
  Administered 2017-04-18 (×2): 50 ug/kg/min via INTRAVENOUS
  Administered 2017-04-18 (×2): 40 ug/kg/min via INTRAVENOUS
  Administered 2017-04-18: 50 ug/kg/min via INTRAVENOUS
  Administered 2017-04-18: 40 ug/kg/min via INTRAVENOUS
  Administered 2017-04-19 – 2017-04-22 (×33): 50 ug/kg/min via INTRAVENOUS
  Filled 2017-04-18 (×40): qty 100

## 2017-04-18 MED ORDER — FENTANYL CITRATE (PF) 100 MCG/2ML IJ SOLN: 50.0000 ug | INTRAMUSCULAR | Status: DC | PRN

## 2017-04-18 MED ORDER — PRISMASOL BGK 4/2.5 32-4-2.5 MEQ/L IV SOLN
INTRAVENOUS | Status: DC
  Administered 2017-04-18 – 2017-04-22 (×31): via INTRAVENOUS_CENTRAL
  Filled 2017-04-18 (×47): qty 5000

## 2017-04-18 MED ORDER — SODIUM CHLORIDE 0.9% FLUSH: 10.0000 mL | INTRAVENOUS | Status: DC | PRN

## 2017-04-18 MED ORDER — PRO-STAT SUGAR FREE PO LIQD
60.0000 mL | Freq: Four times a day (QID) | ORAL | Status: DC
  Administered 2017-04-18 – 2017-04-22 (×16): 60 mL
  Filled 2017-04-18 (×16): qty 60

## 2017-04-18 MED ORDER — FENTANYL CITRATE (PF) 100 MCG/2ML IJ SOLN
50.0000 ug | INTRAMUSCULAR | Status: DC | PRN
  Administered 2017-04-18: 50 ug via INTRAVENOUS
  Filled 2017-04-18: qty 2

## 2017-04-18 MED ORDER — SODIUM POLYSTYRENE SULFONATE 15 GM/60ML PO SUSP
15.0000 g | Freq: Once | ORAL | Status: AC
  Administered 2017-04-18: 15 g via ORAL
  Filled 2017-04-18: qty 60

## 2017-04-18 MED ORDER — POLYETHYLENE GLYCOL 3350 17 G PO PACK: 17.0000 g | PACK | Freq: Every day | ORAL | Status: DC

## 2017-04-18 MED ORDER — ORAL CARE MOUTH RINSE
15.0000 mL | Freq: Four times a day (QID) | OROMUCOSAL | Status: DC
  Administered 2017-04-18: 15 mL via OROMUCOSAL

## 2017-04-18 MED ORDER — DEXTROSE 5 % IV SOLN
500.0000 mg | Freq: Once | INTRAVENOUS | Status: AC
  Administered 2017-04-18: 500 mg via INTRAVENOUS
  Filled 2017-04-18: qty 500

## 2017-04-18 MED ORDER — SODIUM BICARBONATE 8.4 % IV SOLN
50.0000 meq | Freq: Once | INTRAVENOUS | Status: AC
  Administered 2017-04-18: 50 meq via INTRAVENOUS
  Filled 2017-04-18: qty 50

## 2017-04-18 MED ORDER — PERFLUTREN LIPID MICROSPHERE
1.0000 mL | INTRAVENOUS | Status: AC | PRN
  Administered 2017-04-18: 2 mL via INTRAVENOUS
  Filled 2017-04-18: qty 10

## 2017-04-18 MED ORDER — SODIUM CHLORIDE 0.9 % IV SOLN
1.0000 g | Freq: Once | INTRAVENOUS | Status: AC
  Administered 2017-04-18: 1 g via INTRAVENOUS
  Filled 2017-04-18 (×2): qty 10

## 2017-04-18 MED ORDER — SILDENAFIL CITRATE 20 MG PO TABS
20.0000 mg | ORAL_TABLET | Freq: Three times a day (TID) | ORAL | Status: DC
  Administered 2017-04-18 – 2017-04-22 (×14): 20 mg via ORAL
  Filled 2017-04-18 (×14): qty 1

## 2017-04-18 MED ORDER — PROPOFOL 1000 MG/100ML IV EMUL
INTRAVENOUS | Status: AC
  Administered 2017-04-18: 10 ug/kg/min via INTRAVENOUS
  Filled 2017-04-18: qty 100

## 2017-04-18 MED ORDER — SODIUM CHLORIDE 0.9 % IV SOLN: INTRAVENOUS | Status: DC | PRN

## 2017-04-18 MED ORDER — PANTOPRAZOLE SODIUM 40 MG IV SOLR
40.0000 mg | INTRAVENOUS | Status: DC
  Administered 2017-04-18: 40 mg via INTRAVENOUS
  Filled 2017-04-18 (×2): qty 40

## 2017-04-18 MED ORDER — SODIUM CHLORIDE 0.9% FLUSH
10.0000 mL | Freq: Two times a day (BID) | INTRAVENOUS | Status: DC
  Administered 2017-04-18 – 2017-04-20 (×3): 10 mL

## 2017-04-18 MED ORDER — HEPARIN SODIUM (PORCINE) 1000 UNIT/ML DIALYSIS
1000.0000 [IU] | INTRAMUSCULAR | Status: DC | PRN
  Administered 2017-04-18: 2800 [IU] via INTRAVENOUS_CENTRAL
  Filled 2017-04-18: qty 6
  Filled 2017-04-18: qty 4
  Filled 2017-04-18: qty 6

## 2017-04-18 MED ORDER — NOREPINEPHRINE BITARTRATE 1 MG/ML IV SOLN
0.0000 ug/min | INTRAVENOUS | Status: DC
  Administered 2017-04-18: 10 ug/min via INTRAVENOUS
  Administered 2017-04-21: 4 ug/min via INTRAVENOUS
  Filled 2017-04-18 (×2): qty 16

## 2017-04-18 MED ORDER — CHLORHEXIDINE GLUCONATE CLOTH 2 % EX PADS: 6.0000 | MEDICATED_PAD | Freq: Every day | CUTANEOUS | Status: DC

## 2017-04-18 MED ORDER — PROPOFOL 1000 MG/100ML IV EMUL
5.0000 ug/kg/min | INTRAVENOUS | Status: DC
  Administered 2017-04-18: 10 ug/kg/min via INTRAVENOUS

## 2017-04-18 MED ORDER — CHLORHEXIDINE GLUCONATE 0.12% ORAL RINSE (MEDLINE KIT)
15.0000 mL | Freq: Two times a day (BID) | OROMUCOSAL | Status: DC
  Administered 2017-04-18 – 2017-04-22 (×9): 15 mL via OROMUCOSAL

## 2017-04-18 MED ORDER — CHLORHEXIDINE GLUCONATE CLOTH 2 % EX PADS
6.0000 | MEDICATED_PAD | Freq: Every day | CUTANEOUS | Status: DC
  Administered 2017-04-19: 6 via TOPICAL

## 2017-04-18 MED ORDER — FUROSEMIDE 10 MG/ML IJ SOLN
100.0000 mg | Freq: Once | INTRAVENOUS | Status: AC
  Administered 2017-04-18: 100 mg via INTRAVENOUS
  Filled 2017-04-18: qty 10

## 2017-04-18 MED ORDER — DEXTROSE 50 % IV SOLN
1.0000 | Freq: Once | INTRAVENOUS | Status: AC
  Administered 2017-04-18: 50 mL via INTRAVENOUS
  Filled 2017-04-18: qty 50

## 2017-04-18 NOTE — Procedures (Signed)
Cental line and PAC insertion  Indication: shock and AKI  Family consented. Complete sterile condition Time out  Rt IJ MAC inserted, PAC floated. CXR ordered and patient tolerated the procedure  Samul Dada, MD CCM attending

## 2017-04-18 NOTE — Progress Notes (Signed)
Noticed pulmonary artery catheter had two insertions sites during dressing change. Catheter exits below initial insertion site and re-enters skin approximately one inch below. Site cleaned with Chloraprep and dressing changed. Dr Jamison NeighborNestor and Dr Gala RomneyBensimhon paged and made aware. Ordered to remove line.

## 2017-04-18 NOTE — Progress Notes (Signed)
PULMONARY / CRITICAL CARE MEDICINE   Name: William Strong MRN: [id number] DOB: Jan 22, 1941    ADMISSION DATE:  02-May-2017   CONSULTATION DATE:  05-02-17  REFERRING MD:  Dr. Gala Romney   CHIEF COMPLAINT:  Respiratory Distress   HISTORY OF PRESENT ILLNESS:  76 y.o. male with past medical history of atrial fibrillation on systemic anticoagulation with Coumadin, OSA on CPAP therapy, chronic hypoxic respiratory failure on 4 L/m, chronic renal failure, CAD s/p CABG, DM, Dyslipidemia, HTN, Pulmonary HTN, and cor pulmonale. He presented to outside hospital with 3 days of progressive dyspnea. Patient aggressively diuresed with Bumex drip and Primacor drip. With continued worsening in clinical status as well as worsening renal function, hypercapnia, and respiratory acidosis patient was transferred to [facility] for further medical management. PCCM asked to consult for respiratory failure.  SUBJECTIVE:  Patient intubated overnight with worsening respiratory failure and encephalopathy.  REVIEW OF SYSTEMS:  Unable to obtain given intubated status.  VITAL SIGNS: BP 97/60   Pulse 100   Temp 98.4 F (36.9 C)   Resp (!) 24   Wt (!) 339 lb (153.8 kg)   SpO2 91%   HEMODYNAMICS: PAP: (48-116)/(29-89) 55/32 CVP:  [14 mmHg-34 mmHg] 21 mmHg CO:  [7.2 L/min-8 L/min] 7.2 L/min CI:  [2.8 L/min/m2-3 L/min/m2] 2.8 L/min/m2  VENTILATOR SETTINGS: Vent Mode: PCV FiO2 (%):  [80 %-100 %] 80 % Set Rate:  [22 bmp-24 bmp] 24 bmp PEEP:  [10 cmH20] 10 cmH20 Plateau Pressure:  [25 cmH20] 25 cmH20  INTAKE / OUTPUT: I/O last 3 completed shifts: In: 907.5 [I.V.:537.5; NG/GT:210; IV Piggyback:160] Out: 170 [Urine:70; Emesis/NG output:100]  PHYSICAL EXAMINATION: General:  No acute distress. Children at bedside.  Integument:  Warm & dry. No rash on exposed skin.  HEENT:  Moist mucus membranes. No scleral injection or icterus. Endotracheal tube in place.  Cardiovascular:  Regular rate.No appreciable JVD.   Pulmonary:  Distant breath sounds. Overall clear with auscultation. Symmetric chest wall rise on ventilator. Abdomen: Soft. Normal bowel sounds. Protuberant. Neurological: Sedated. Pupils symmetric. No spontaneous movements.  LABS:  BMET  Recent Labs Lab 04/18/17 0318 04/18/17 0451 04/18/17 0506 04/18/17 0638  NA 140 139 141 139  K 6.1* 5.8* 5.6* 5.6*  CL 108 107  --  107  CO2 23 21*  --  21*  BUN 116* 116*  --  115*  CREATININE 6.22* 6.13*  --  6.20*  GLUCOSE 188* 172* 160* 151*    Electrolytes  Recent Labs Lab 05-02-2017 2322 04/18/17 0318 04/18/17 0451 04/18/17 0638  CALCIUM 7.1* 7.1* 7.0* 7.2*  MG 1.9  --   --   --     CBC  Recent Labs Lab 2017/05/02 2322 04/18/17 0506  WBC 8.4  --   HGB 10.3* 10.2*  HCT 32.2* 30.0*  PLT 131*  --     Coag's  Recent Labs Lab May 02, 2017 2322  APTT 45*  INR 2.98    Sepsis Markers  Recent Labs Lab 2017/05/02 2322 04/18/17 0319  LATICACIDVEN 0.8 1.0    ABG  Recent Labs Lab May 02, 2017 2354 04/18/17 0253  PHART 7.166* 7.170*  PCO2ART 61.9* 58.8*  PO2ART 68.0* 76.4*    Liver Enzymes  Recent Labs Lab 05-02-2017 2322  AST 16  ALT 11*  ALKPHOS 84  BILITOT 0.9  ALBUMIN 3.4*    Cardiac Enzymes  Recent Labs Lab 04/18/17 0451 04/18/17 0638  TROPONINI 0.05* 0.06*    Glucose  Recent Labs Lab 04/18/17 0216 04/18/17 0336 04/18/17 0808  GLUCAP  212* 181* 129*    Imaging Dg Chest Port 1 View  Result Date: 04/18/2017 CLINICAL DATA:  PICC placement.  Initial encounter. EXAM: PORTABLE CHEST 1 VIEW COMPARISON:  Chest radiograph performed earlier today at 12:34 a.m. FINDINGS: The patient's right IJ Swan-Ganz catheter takes an unusually sharp turn at the level of the right atrium, though it is thought to end overlying the pulmonary outflow tract. It appears to be mildly coiled at the pulmonary outflow tract, and could be retracted approximately 3 cm. The endotracheal tube is seen ending 2-3 cm above the  carina. An enteric tube is noted extending below the diaphragm. Vascular congestion is noted. Increased interstitial markings raise concern for pulmonary edema. No definite pleural effusion or pneumothorax is seen. The cardiomediastinal silhouette is enlarged. The patient is status post median sternotomy. No acute osseous abnormalities are identified. IMPRESSION: 1. Right IJ Swan-Ganz catheter takes an unusually sharp turn at the level of the right atrium, though it is thought to extend overlying the right pulmonary outflow tract. It appears to be mildly coiled at the pulmonary outflow tract, and could be retracted approximately 3 cm, as deemed clinically appropriate. 2. Endotracheal tube noted ending 2-3 cm above the carina. 3. Vascular congestion and cardiomegaly. Increased interstitial markings raise concern for pulmonary edema. These results were called by telephone at the time of interpretation on 04/18/2017 at 3:27 am to [id number], who verbally acknowledged these results. Electronically Signed   By: Roanna RaiderJeffery  Chang M.D.   On: 04/18/2017 03:28   Dg Chest Port 1 View  Result Date: 04/18/2017 CLINICAL DATA:  Intubation EXAM: PORTABLE CHEST 1 VIEW COMPARISON:  04/24/2017 FINDINGS: Lung bases are not included. Interval intubation, tip of the endotracheal tube is about 2.2 cm superior to carina. Post sternotomy changes. Cardiomegaly with central vascular congestion and diffuse edema. No pneumothorax. IMPRESSION: Endotracheal tube tip about 2.2 cm superior to carina. Cardiomegaly with vascular congestion and diffuse edema. Non inclusion of lung bases. Electronically Signed   By: Jasmine PangKim  Fujinaga M.D.   On: 04/18/2017 01:08   Dg Chest Port 1 View  Result Date: 04/21/2017 CLINICAL DATA:  Acute onset of respiratory failure. Hypoxia. Initial encounter. EXAM: PORTABLE CHEST 1 VIEW COMPARISON:  None. FINDINGS: The lungs are well-aerated. Vascular congestion is noted. Diffusely increased interstitial markings  raise concern for pulmonary edema. A small right pleural effusion is suspected. No pneumothorax is seen. The cardiomediastinal silhouette is mildly enlarged. The patient is status post median sternotomy. Evaluation is somewhat suboptimal due to patient rotation. No acute osseous abnormalities are seen. IMPRESSION: Vascular congestion and mild cardiomegaly. Suspect small right pleural effusion. Diffusely increased interstitial markings raise concern for pulmonary edema. Electronically Signed   By: Roanna RaiderJeffery  Chang M.D.   On: 05/01/2017 23:47     STUDIES:  PORT CXR 8/16:  Personally reviewed by me. Endotracheal tube in goodpositon. Enteric feeding tube coursing below the diaphragm. Bilateral hilar fullness. Elevation of left hemidiaphragm. Pulmonary arterial catheter appears slightly coiled.  MICROBIOLOGY: MRSA PCR 8/15:  Negative   ANTIBIOTICS: None.   SIGNIFICANT EVENTS: 8/15 - Transferred from OSH & intubated w/ worsening respiratory status & encephalopathy 8/16 - PA catheter placed early in the day  LINES/TUBES: OETT 8/16 >>> R IJ PA CATHETER 8/16 >>> L RADIAL ART LINE >>> OGT 8/16 >>> FOLEY 8/16 >>> PIV  ASSESSMENT / PLAN:  PULMONARY A: Acute on chronic hypoxic respiratory failure: Multifactorial in the setting of pulmonary edema and pulmonary hypertension. Uses 4 L/m at home. Acute  hypercarbic respiratory failure: Question possible underlying COPD. Pulmonary hypertension: Multifactorial. OSA: On CPAP therapy at home. Possible COPD: Chronic tobacco use. Tobacco use disorder: Quit smoking tobacco 7 months prior to admission.  P:   Continuing full ventilator support  Weaning FiO2 for saturation >92% Repeat portable chest x-ray tomorrow morning Trending ABG every 12 hours starting at 5pm x2  Continuing Revatio 20 mg every 6 hours Starting Duonebs every 6 hours Repeat ABG in 30 minutes  CARDIOVASCULAR A:  Acute on chronic congestive heart failure: History of diastolic  congestive heart failure.  H/O CAD s/p CABG, HTN, HLD, paroxysmal atrial fibrillation   P:  Weaning Vasopressor support Continuing PA Catheter w/ monitoring per Heart Failure Service Ordering Complete Echocardiogram Continuous telemetry monitoring Heparin drip per pharmacy protocol Holding home antihypertensive regimen Goal MAP >65 Heart failure service following & managing Checking LLE Venous Duplex with swelling  RENAL A:   Acute on chronic renal failure stage IV: Multifactorial. Known history of urinary retention. Hyperkalemia: Mild. Improving. S/P Kayexalate. History of urinary retention  Metabolic acidosis: Mild.  P:   Nephrology consulted & recommendations Trending electrolytes and renal function daily Monitoring urine output with Foley catheter  Renal U/S pending   GASTROINTESTINAL A:   No acute issues.   P:   Nothing by mouth   HEMATOLOGIC A:   Anemia: Likely secondary to chronic disease. No evidence of active bleeding. Coagulopathy: Secondary to chronic anticoagulation with Coumadin.  Thrombocytopenia: Mild.  P:  Trending cell counts daily with CBC Plan to transfuse for hemoglobin <7.0 or active bleeding   INFECTIOUS A:   No acute issues.  P:   Monitoring for signs/symptoms of infection.  ENDOCRINE A:   DM:  Glucose controlled.  P:   Accu-Cheks every 4 hours Sliding-scale insulin per standard scale Checking hemoglobin A1c  NEUROLOGIC A:   Acute encephalopathy: Likely multifactorial from hypoxia and hypercarbia. Sedation on ventilator   P:   RASS goal: 0 to -1 Propofol infusion titrating Fentanyl IV when necessary pain  Versed IV when necessary sedation   Prophylaxis:  Heparin drip per pharmacy protocol. Protonix IV daily.  Diet:  NPO. Dietician consulted for tube feedings. Code Status:  Full Code per previous physician discussions. Disposition:  Remains critically ill in the ICU. Family Update: Son and daughter updated at length at  bedside by me.  DISCUSSION:  76 y.o. male with multiple medical problems including chronic renal failure, diastolic congestive heart failure, and chronic hypoxic respiratory failure. Prognosis is poor. Discussed at length with patient's son and daughter at bedside. Plan for dialysis access placement after seen by nephrology.  I have spent a total of 33 minutes of critical care time today caring for the patient, updating family at bedside, and reviewing the patient's electronic medical record.   Donna Christen Jamison Neighbor, M.D. [facility] Pager:  [phone] After 3pm or if no response, call [phone] 9:09 AM 04/18/17

## 2017-04-18 NOTE — Procedures (Signed)
Intubation:  Indication: acute hypoxic and hypercapnic resp failure  Time out, meds: ketamine Patient positioned. DL 1st attempt, size 8 ETT Position conformed by CXR, CO2 detector and auscultation  Patient tolerated the procedure  Oswaldo MilianAkram Duncan Alejandro, MD CCM attending

## 2017-04-18 NOTE — Progress Notes (Signed)
Called MD Kasa regarding patients ABG and radiology report. RT to call MD. And Kasa to assess the x-ray. Will continue to monitor patient.  Horton ChinMacKayla A Shamiracle Gorden, RN

## 2017-04-18 NOTE — Progress Notes (Signed)
Swan catheter repositioned by Dr. Gala RomneyBensimhon at the bedside. Chest x-ray ordered for placement confirmation.

## 2017-04-18 NOTE — Care Management Note (Signed)
Case Management Note  Patient Details  Name: William Strong MRN: [id number] Date of Birth: 1940-09-08  Subjective/Objective:   From home, presents from OSH with progressive right side heart failure complicated by CKD. [facility]. Intubated on vent on levophed and driprovan.               Action/Plan: NCM will follow for dc needs.   Expected Discharge Date:                  Expected Discharge Plan:     In-House Referral:     Discharge planning Services  CM Consult  Post Acute Care Choice:    Choice offered to:     DME Arranged:    DME Agency:     HH Arranged:    HH Agency:     Status of Service:  In process, will continue to follow  If discussed at Long Length of Stay Meetings, dates discussed:    Additional Comments:  Leone Havenaylor, Neya Creegan Clinton, RN 04/18/2017, 10:39 AM

## 2017-04-18 NOTE — Consult Note (Signed)
[facility] Consultation Note Requesting Physician:  Bensimhon, MD Primary Nephrologist: Unknown Reason for Consult:    HPI: The patient is a 76 y.o. 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 He has Mhx significant for chronic hypoxic respiratory failure (on 4L o2 at home), pHTN, known right heart failure, OSA on CPAP, CAD s/p CABG, HTN, HLD, CKD stage 4, DM2, chronic anemia and PAF. History spotty, however he was admitted from clinic with complaint of worsening DOE and volume overload. He was diuresed aggressively in OSH w/ Bumex ggt and was on milrinone for RV systolic dysfunction however he continued to deteriorate as manifested by worsening renal failure and hypercapnia with respiratory acidosis. He was subsequently transferred to [facility] for further treatment.   Since arrival he has had worsening respiratory status requiring intubation. He failed trial of high-dose lasix. He remains on ventilator, sedated with propofol and on levophed for pressure support.   Nephrology has been asked to evaluate due to acute renal failure, volume overload and worsening right heart failure/respiratory failure despite aggressive IV diuresis. He has had minimal urine output. On arrival to [facility] he was hyperkalemic (6) with elevated BUN and Cr (117 and 6.23 respectively). No leukocytosis. ABG with pH 7.16, pCO2 62 and O2 of 68.  Creatinine, Ser  Date/Time Value Ref Range Status  04/18/2017 06:38 AM 6.20 (H) 0.61 - 1.24 mg/dL Final  04/18/2017 04:51 AM 6.13 (H) 0.61 - 1.24 mg/dL Final  04/18/2017 03:18 AM 6.22 (H) 0.61 - 1.24 mg/dL Final  05/03/2017 11:22 PM 6.23 (H) 0.61 - 1.24 mg/dL Final   Past Medical History:  Past Medical History:  Diagnosis Date  . A-fib ([place])   . CAD (coronary artery disease)   . Diabetes ([place])   . Dyslipidemia   . HTN  (hypertension)   . Hx of CABG   . Pulmonary HTN ([place])   . Right-sided heart failure [facility])     Past Surgical History:  Past Surgical History:  Procedure Laterality Date  . CORONARY ARTERY BYPASS GRAFT  2008   [facility]    Family History:  Family History  Problem Relation Age of Onset  . CAD Mother   . Hypertension Mother    Social History:  reports that he quit smoking about 7 months ago. He does not have any smokeless tobacco history on file. He reports that he drinks alcohol. He reports that he does not use drugs.  Allergies:  Allergies  Allergen Reactions  . Oxycontin [Oxycodone Hcl] Shortness Of Breath, Nausea And Vomiting and Other (See Comments)    Reported by patient    Home medications: Prior to Admission medications   Medication Sig Start Date End Date Taking? Authorizing Provider  acetaminophen (TYLENOL) 325 MG tablet Take 650 mg by mouth every 4 (four) hours as needed for moderate pain or headache.   Yes [provider]  allopurinol (ZYLOPRIM) 300 MG tablet Take 300 mg by mouth every Monday, Wednesday, and Friday.   Yes [provider]  aspirin EC 81 MG tablet Take 81 mg by mouth daily.   Yes [provider]  atorvastatin (LIPITOR) 20 MG tablet Take 20 mg by mouth daily.   Yes [provider]  B Complex-C-Biotin-D-Zinc-FA (VITAL-D RX PO) Take 1 tablet by mouth daily.   Yes [provider]  benzonatate (TESSALON) 100 MG capsule Take 100 mg  by mouth every 8 (eight) hours as needed for cough.   Yes [provider]  bisacodyl (DULCOLAX) 10 MG suppository Place 10 mg rectally daily as needed for moderate constipation.   Yes [provider]  calcitRIOL (ROCALTROL) 0.25 MCG capsule Take 0.25 mcg by mouth daily.   Yes [provider]  calcium acetate (PHOSLO) 667 MG capsule Take 1,334 mg by mouth 3 (three) times daily with meals.   Yes [provider]  docusate sodium (COLACE) 100 MG capsule Take 100  mg by mouth 2 (two) times daily.   Yes [provider]  insulin detemir (LEVEMIR) 100 UNIT/ML injection Inject 15 Units into the skin at bedtime.   Yes [provider]  insulin lispro (HUMALOG) 100 UNIT/ML injection Inject 2-12 Units into the skin 4 (four) times daily. Per sliding scale   Yes [provider]  LORazepam (ATIVAN) 0.5 MG tablet Take 0.5 mg by mouth 2 (two) times daily as needed for anxiety.   Yes [provider]  magnesium hydroxide (MILK OF MAGNESIA) 400 MG/5ML suspension Take 30 mLs by mouth every 6 (six) hours as needed for mild constipation.   Yes [provider]  metoprolol tartrate (LOPRESSOR) 25 MG tablet Take 25 mg by mouth 2 (two) times daily.   Yes [provider]  polyethylene glycol (MIRALAX / GLYCOLAX) packet Take 17 g by mouth daily.   Yes [provider]  sildenafil (REVATIO) 20 MG tablet Take 80 mg by mouth 3 (three) times daily.   Yes [provider]  tamsulosin (FLOMAX) 0.4 MG CAPS capsule Take 0.4 mg by mouth daily.   Yes [provider]  warfarin (COUMADIN) 5 MG tablet Take 5-7.5 mg by mouth See admin instructions. Take 7.5 mg by mouth only on Tuesday and take 5 mg by mouth daily on all other days.   Yes [provider]   Inpatient medications: . chlorhexidine gluconate (MEDLINE KIT)  15 mL Mouth Rinse BID  . Chlorhexidine Gluconate Cloth  6 each Topical Daily  . insulin aspart  2-6 Units Subcutaneous Q4H  . ipratropium-albuterol  3 mL Nebulization Q6H  . mouth rinse  15 mL Mouth Rinse 10 times per day  . pantoprazole (PROTONIX) IV  40 mg Intravenous Q24H  . sildenafil  20 mg Oral Q8H  . sodium chloride flush  10-40 mL Intracatheter Q12H  . sodium chloride flush  3 mL Intravenous Q12H  Continuous Medications: . sodium chloride 10 mL/hr (04/18/17 0831)  . sodium chloride    . sodium chloride    . norepinephrine (LEVOPHED) Adult infusion 10 mcg/min (04/18/17 1300)  .  dialysis replacement fluid (prismasate)    . dialysis replacement fluid (prismasate)    . dialysate (PRISMASATE)    . propofol (DIPRIVAN) infusion 35 mcg/kg/min (04/18/17 1308)  . sodium chloride     Review of Systems: patient sedated on vent   Physical Exam:  Blood pressure 126/66, pulse (!) 103, temperature 98.4 F (36.9 C), resp. rate (!) 28, height _0  (1.778 m), weight (!) 339 lb (153.8 kg), SpO2 96 %.  Gen: Morbidly obese male. Sedated. On vent. Lines/tubes: ETT, OG, PIV x2. Left radial A-line. Gordy Councilman (RIJ) Skin: no rash, cyanosis Neck: +JVD. Thick neck. Chest: No obvious murmur. Tachycardic. Abdomen: soft, not distended. Ext: Warm and perfused. 2-3+ BL LE edema.  Neuro: RAAS -3. Does not follow commands. PEERL but sluggish.   Labs: Renal Panel:  Recent Labs Lab 04/28/2017 2322 04/18/17 0318 04/18/17 0451  04/18/17 0506 04/18/17 0638  NA 140 140 139 141 139  K 6.0* 6.1* 5.8* 5.6* 5.6*  CL 108 108 107  --  107  CO2 22 23 21*  --  21*  GLUCOSE 149* 188* 172* 160* 151*  BUN 117* 116* 116*  --  115*  CREATININE 6.23* 6.22* 6.13*  --  6.20*  CALCIUM 7.1* 7.1* 7.0*  --  7.2*   Liver Function Tests:  Recent Labs Lab 04/21/2017 2322  AST 16  ALT 11*  ALKPHOS 84  BILITOT 0.9  PROT 6.6  ALBUMIN 3.4*   No results for input(s): LIPASE, AMYLASE in the last 168 hours. No results for input(s): AMMONIA in the last 168 hours. CBC:  Recent Labs Lab 04/19/2017 2322 04/18/17 0506  WBC 8.4  --   NEUTROABS 6.8  --   HGB 10.3* 10.2*  HCT 32.2* 30.0*  MCV 85.0  --   PLT 131*  --    PT/INR: _0 (inr:5) Cardiac Enzymes: ) Recent Labs Lab 04/18/17 0451 04/18/17 0638  TROPONINI 0.05* 0.06*   CBG:  Recent Labs Lab 04/18/17 0216 04/18/17 0336 04/18/17 0808  GLUCAP 212* 181* 129*    Iron Studies: No results for input(s): IRON, TIBC, TRANSFERRIN, FERRITIN in the last 168 hours.  Xrays/Other Studies:  ECHO 04/16/17 ([place]) with LVEF 55-60%, mildly  dilated RV w/ severe RV systolic dysfunction, mildly dilated LA and severely dilated RA with mild MAC w/ mild MR, TR and mild pulmonary HTN with estimated PAS 43 mmHg.   Dg Chest Port 1 View  Result Date: 04/18/2017 CLINICAL DATA:  Acute respiratory failure, hypoxia EXAM: PORTABLE CHEST 1 VIEW COMPARISON:  04/18/2017 FINDINGS: Endotracheal tube and NG tube are stable. Repositioning of the Swan-Ganz catheter with the tip in the right lower lobe pulmonary artery. Cardiomegaly with vascular congestion and mild pulmonary edema, stable. Possible small layering effusions. IMPRESSION: No significant change in the CHF pattern. Suspect small layering effusions. Electronically Signed   By: Rolm Baptise M.D.   On: 04/18/2017 11:02   Dg Chest Port 1 View  Result Date: 04/18/2017 CLINICAL DATA:  PICC placement.  Initial encounter. EXAM: PORTABLE CHEST 1 VIEW COMPARISON:  Chest radiograph performed earlier today at 12:34 a.m. FINDINGS: The patient's right IJ Swan-Ganz catheter takes an unusually sharp turn at the level of the right atrium, though it is thought to end overlying the pulmonary outflow tract. It appears to be mildly coiled at the pulmonary outflow tract, and could be retracted approximately 3 cm. The endotracheal tube is seen ending 2-3 cm above the carina. An enteric tube is noted extending below the diaphragm. Vascular congestion is noted. Increased interstitial markings raise concern for pulmonary edema. No definite pleural effusion or pneumothorax is seen. The cardiomediastinal silhouette is enlarged. The patient is status post median sternotomy. No acute osseous abnormalities are identified. IMPRESSION: 1. Right IJ Swan-Ganz catheter takes an unusually sharp turn at the level of the right atrium, though it is thought to extend overlying the right pulmonary outflow tract. It appears to be mildly coiled at the pulmonary outflow tract, and could be retracted approximately 3 cm, as deemed clinically  appropriate. 2. Endotracheal tube noted ending 2-3 cm above the carina. 3. Vascular congestion and cardiomegaly. Increased interstitial markings raise concern for pulmonary edema. These results were called by telephone at the time of interpretation on 04/18/2017 at 3:27 am to Nursing on [facility], who verbally acknowledged these results. Electronically Signed   By: Garald Balding M.D.   On:  04/18/2017 03:28   Dg Chest Port 1 View  Result Date: 04/18/2017 CLINICAL DATA:  Intubation EXAM: PORTABLE CHEST 1 VIEW COMPARISON:  04/23/2017 FINDINGS: Lung bases are not included. Interval intubation, tip of the endotracheal tube is about 2.2 cm superior to carina. Post sternotomy changes. Cardiomegaly with central vascular congestion and diffuse edema. No pneumothorax. IMPRESSION: Endotracheal tube tip about 2.2 cm superior to carina. Cardiomegaly with vascular congestion and diffuse edema. Non inclusion of lung bases. Electronically Signed   By: Donavan Foil M.D.   On: 04/18/2017 01:08   Dg Chest Port 1 View  Result Date: 05/03/2017 CLINICAL DATA:  Acute onset of respiratory failure. Hypoxia. Initial encounter. EXAM: PORTABLE CHEST 1 VIEW COMPARISON:  None. FINDINGS: The lungs are well-aerated. Vascular congestion is noted. Diffusely increased interstitial markings raise concern for pulmonary edema. A small right pleural effusion is suspected. No pneumothorax is seen. The cardiomediastinal silhouette is mildly enlarged. The patient is status post median sternotomy. Evaluation is somewhat suboptimal due to patient rotation. No acute osseous abnormalities are seen. IMPRESSION: Vascular congestion and mild cardiomegaly. Suspect small right pleural effusion. Diffusely increased interstitial markings raise concern for pulmonary edema. Electronically Signed   By: Garald Balding M.D.   On: 04/09/2017 23:47   Background: This is a critically-ill 76 y/o M here from outside hospital with right heart failure, acute (oliguric) on  chronic renal failure, acute on chronic respiratory failure and massive volume overload. He did not respond well to aggressive IV diuresis. CRRT initiated 04/18/17.  Assessment/Recommendations  1. Acute on Chronic Renal Failure, Cardiorenal syndrome: CKD3-4 at BL. Does have hx of DM2 and HTN. Presents here with oliguric renal failure most likely secondary to cardiorenal syndrome/shock. Cr 6.23 on admission and hyperkalemic to 6.1. He is massively volume overloaded and did not respond as desired to high-dose IV diuresis. Given this, and his significant right heart failure, patient will be started on CRRT today.  1. CRRT 2. I&O's, daily weights 3. Daily renal function panels, BID 2. Massive Right Heart Failure, preserved LVEF: Severe RV dysfunction noted on recent echo. Did not respond to aggressive diureses at outside hospital. HF on, will need RHC once volume status improved.  3. Acute on Chronic Respiratory Failure, pHTN: On 4L O2 via [place] at home at baseline. Currently intubated on full vent support. Worsening resp status no doubt secondary to volume status in setting of his chronic respiratory failure 1. CCM on, appreciate 2. Vent 3. Sildenafil 1m TID 4. Hypotension/Shock: Cardiogenic. On levophed currently.  5. Hyperkalemia: 6.1 max this morning, now 5.6 this morning after Kayexalate 325m Received calcium gluconate. Remainder of lytes OK so far. 1. Monitor renal function closely with daily renal function panels  6. CAD with hx of CABG: Doesn't appear to be active CAD/ischemia currently. Monitor per cards 7. Atrial Fibrillation: ?On warfarin at home?  1. Will follow on telemetry BeEinar GipDO [facility]  04/18/2017, 11:51 AM

## 2017-04-18 NOTE — Progress Notes (Signed)
  Echocardiogram 2D Echocardiogram with Definity has been performed.  Nolon RodBrown, Tony 04/18/2017, 4:06 PM

## 2017-04-18 NOTE — Progress Notes (Signed)
Preliminary results by tech - Venous Duplex Left Lower Ext. Completed. Negative for deep vein thrombosis. Marilynne Halstedita Yaritsa Savarino, BS, RDMS, RVT

## 2017-04-18 NOTE — Progress Notes (Signed)
eLink Physician-Brief Progress Note Patient Name: William Strong DOB: 12/10/1940 MRN: [id number]   Date of Service  04/18/2017  HPI/Events of Note  K trending downwards 6.1-->5.8-->5.6  eICU Interventions  No further interventions at this time Follow up chem 7 by primary team     Intervention Category Evaluation Type: Other  Erin FullingKurian Audie Stayer 04/18/2017, 6:26 AM

## 2017-04-18 NOTE — Progress Notes (Signed)
Called K results from istat to Jovita KussmaulKatalina Eubanks, NP. Also notified of application of bair-hugger for patient temp 35.9. No new orders at this time. Will continue to monitor.  Horton ChinMacKayla A Marinda Tyer, RN

## 2017-04-18 NOTE — Progress Notes (Signed)
Initial Nutrition Assessment  DOCUMENTATION CODES:   Morbid obesity  INTERVENTION:    Initiate Vital High Protein at goal rate of 20 ml/h (240 ml per day) and Prostat 60 ml QID   TF regimen + Propofol to provide total of 2256 kcals, 162 gm protein, 401 ml free water daily  NUTRITION DIAGNOSIS:   Inadequate oral intake related to inability to eat as evidenced by NPO status  GOAL:   Patient will meet greater than or equal to 90% of their needs  MONITOR:   Vent status, TF tolerance, Labs, Weight trends, Skin, I & O's  REASON FOR ASSESSMENT:   Consult Enteral/tube feeding initiation and management  ASSESSMENT:   76 y.o. Male w/ h/o CAD s/p CABG in 2008 at [facility] (LIMA-LAD known to be atretic, SVG-OM1, SVG-PDA), R-sided HF w/ preserved LVEF, pulm HTN (severe on RHC Jan 2018), chronic renal failure, parox afib, HTN, dyslipidemia, DM2 [facility] HF/renal failure.  Patient is currently intubated on ventilator >> OGT in place Temp (24hrs), Avg:97.6 F (36.4 C), Min:96.6 F (35.9 C), Max:98.6 F (37 C)  Propofol: 37 ml/hr >> 976 lipid kcals    Pt intubated this AM due to acute hypoxic and hypercapnic respiratory failure. PCCM note reviewed. Pt with chronic tobacco use. Possible underlying COPD. Acute on chronic renal failure. Nephrology consulted. Labs and medications reviewed. K 5.6 (H). CBG's 212-181-129.  Unable to complete Nutrition-Focused physical exam at this time.   Diet Order:  Diet NPO time specified  Skin:  Reviewed, no issues  Last BM:  PTA  Height:   Ht Readings from Last 1 Encounters:  04/18/17 5\' 10"  (1.778 m)   Weight:   Wt Readings from Last 1 Encounters:  04/18/17 (!) 339 lb (153.8 kg)   Ideal Body Weight:  75.4 kg  BMI:  Body mass index is 48.64 kg/m.  Estimated Nutritional Needs:   Kcal:  [id number]  Protein:  >/= 150 gm  Fluid:  per MD  EDUCATION NEEDS:   Education needs no appropriate at  this time  Maureen ChattersKatie Royalty Fakhouri, RD, LDN Pager #: [phone] After-Hours Pager #: [phone]

## 2017-04-18 NOTE — Progress Notes (Addendum)
Advanced Heart Failure Rounding Note  Primary Cardiologist: [facility] HF: NEW (Dr. Haroldine Laws)   Subjective:    Transferred from [place], [place] yesterday with R sided HF, pulm HTN, ARF and acute respiratory failure.   Intubated last night with ph 7.1. Now sedated on vent.   Swan numbers    CVP 20 PA 57/34  PCW 16 CO 7.0 CI 2.7  Echo 04/16/17 (in [place]) - Mildly dilated LV w/o LVH, EF est 55-60%, mildly dilated RV w/ severe RV systolic dysfunction, mildly dilated LA, severely dilated RA, mild MAC w/ mild MR, mild TR, mild pulm HTN w/ est PAS ~52mHg  Objective:   Weight Range: (!) 339 lb (153.8 kg) There is no height or weight on file to calculate BMI.   Vital Signs:   Temp:  [96.6 F (35.9 C)-98.6 F (37 C)] 98.6 F (37 C) (08/16 0930) Pulse Rate:  [87-101] 101 (08/16 0930) Resp:  [16-28] 25 (08/16 0930) BP: (95-126)/(56-77) 126/66 (08/16 0930) SpO2:  [89 %-95 %] 92 % (08/16 0930) FiO2 (%):  [80 %-100 %] 80 % (08/16 0800) Weight:  [339 lb (153.8 kg)-340 lb (154.2 kg)] 339 lb (153.8 kg) (08/16 0430) Last BM Date:  (pta)  Weight change: Filed Weights   04/18/17 0000 04/18/17 0430  Weight: (!) 340 lb (154.2 kg) (!) 339 lb (153.8 kg)    Intake/Output:   Intake/Output Summary (Last 24 hours) at 04/18/17 0938 Last data filed at 04/18/17 0900  Gross per 24 hour  Intake           1037.9 ml  Output              330 ml  Net            707.9 ml      Physical Exam    General:  Well appearing. No resp difficulty HEENT: Normal Neck: Supple. JVP . Carotids 2+ bilat; no bruits. No lymphadenopathy or thyromegaly appreciated. Cor: PMI nondisplaced. Regular rate & rhythm. No rubs, gallops or murmurs. Lungs: Clear Abdomen: Soft, nontender, nondistended. No hepatosplenomegaly. No bruits or masses. Good bowel sounds. Extremities: No cyanosis, clubbing, rash, edema Neuro: Alert & orientedx3, cranial nerves grossly intact. moves all 4 extremities w/o  difficulty. Affect pleasant   Telemetry   NSR Personally reviewed   EKG    Probable NSR with RBBB. Low volts Personally reviewed   Labs    CBC  Recent Labs  04/09/2017 2322 04/18/17 0506  WBC 8.4  --   NEUTROABS 6.8  --   HGB 10.3* 10.2*  HCT 32.2* 30.0*  MCV 85.0  --   PLT 131*  --    Basic Metabolic Panel  Recent Labs  04/15/2017 2322  04/18/17 0451 04/18/17 0506 04/18/17 0638  NA 140  < > 139 141 139  K 6.0*  < > 5.8* 5.6* 5.6*  CL 108  < > 107  --  107  CO2 22  < > 21*  --  21*  GLUCOSE 149*  < > 172* 160* 151*  BUN 117*  < > 116*  --  115*  CREATININE 6.23*  < > 6.13*  --  6.20*  CALCIUM 7.1*  < > 7.0*  --  7.2*  MG 1.9  --   --   --   --   < > = values in this interval not displayed. Liver Function Tests  Recent Labs  04/10/2017 2322  AST 16  ALT 11*  ALKPHOS 84  BILITOT 0.9  PROT 6.6  ALBUMIN 3.4*   No results for input(s): LIPASE, AMYLASE in the last 72 hours. Cardiac Enzymes  Recent Labs  04/18/17 0451 04/18/17 0638  TROPONINI 0.05* 0.06*    BNP: BNP (last 3 results)  Recent Labs  04/15/2017 2322  BNP 296.5*    ProBNP (last 3 results) No results for input(s): PROBNP in the last 8760 hours.   D-Dimer No results for input(s): DDIMER in the last 72 hours. Hemoglobin A1C No results for input(s): HGBA1C in the last 72 hours. Fasting Lipid Panel  Recent Labs  04/18/17 0317  TRIG 118   Thyroid Function Tests No results for input(s): TSH, T4TOTAL, T3FREE, THYROIDAB in the last 72 hours.  Invalid input(s): FREET3  Other results:   Imaging    Dg Chest Port 1 View  Result Date: 04/18/2017 CLINICAL DATA:  PICC placement.  Initial encounter. EXAM: PORTABLE CHEST 1 VIEW COMPARISON:  Chest radiograph performed earlier today at 12:34 a.m. FINDINGS: The patient's right IJ Swan-Ganz catheter takes an unusually sharp turn at the level of the right atrium, though it is thought to end overlying the pulmonary outflow tract. It  appears to be mildly coiled at the pulmonary outflow tract, and could be retracted approximately 3 cm. The endotracheal tube is seen ending 2-3 cm above the carina. An enteric tube is noted extending below the diaphragm. Vascular congestion is noted. Increased interstitial markings raise concern for pulmonary edema. No definite pleural effusion or pneumothorax is seen. The cardiomediastinal silhouette is enlarged. The patient is status post median sternotomy. No acute osseous abnormalities are identified. IMPRESSION: 1. Right IJ Swan-Ganz catheter takes an unusually sharp turn at the level of the right atrium, though it is thought to extend overlying the right pulmonary outflow tract. It appears to be mildly coiled at the pulmonary outflow tract, and could be retracted approximately 3 cm, as deemed clinically appropriate. 2. Endotracheal tube noted ending 2-3 cm above the carina. 3. Vascular congestion and cardiomegaly. Increased interstitial markings raise concern for pulmonary edema. These results were called by telephone at the time of interpretation on 04/18/2017 at 3:27 am to Nursing on [facility], who verbally acknowledged these results. Electronically Signed   By: Garald Balding M.D.   On: 04/18/2017 03:28   Dg Chest Port 1 View  Result Date: 04/18/2017 CLINICAL DATA:  Intubation EXAM: PORTABLE CHEST 1 VIEW COMPARISON:  04/11/2017 FINDINGS: Lung bases are not included. Interval intubation, tip of the endotracheal tube is about 2.2 cm superior to carina. Post sternotomy changes. Cardiomegaly with central vascular congestion and diffuse edema. No pneumothorax. IMPRESSION: Endotracheal tube tip about 2.2 cm superior to carina. Cardiomegaly with vascular congestion and diffuse edema. Non inclusion of lung bases. Electronically Signed   By: Donavan Foil M.D.   On: 04/18/2017 01:08   Dg Chest Port 1 View  Result Date: 05/01/2017 CLINICAL DATA:  Acute onset of respiratory failure. Hypoxia. Initial encounter.  EXAM: PORTABLE CHEST 1 VIEW COMPARISON:  None. FINDINGS: The lungs are well-aerated. Vascular congestion is noted. Diffusely increased interstitial markings raise concern for pulmonary edema. A small right pleural effusion is suspected. No pneumothorax is seen. The cardiomediastinal silhouette is mildly enlarged. The patient is status post median sternotomy. Evaluation is somewhat suboptimal due to patient rotation. No acute osseous abnormalities are seen. IMPRESSION: Vascular congestion and mild cardiomegaly. Suspect small right pleural effusion. Diffusely increased interstitial markings raise concern for pulmonary edema. Electronically Signed   By: Francoise Schaumann.D.  On: 04/06/2017 23:47      Medications:     Scheduled Medications: . chlorhexidine gluconate (MEDLINE KIT)  15 mL Mouth Rinse BID  . insulin aspart  2-6 Units Subcutaneous Q4H  . ipratropium-albuterol  3 mL Nebulization Q6H  . mouth rinse  15 mL Mouth Rinse QID  . pantoprazole (PROTONIX) IV  40 mg Intravenous Q24H  . sildenafil  20 mg Oral Q8H  . sodium chloride flush  3 mL Intravenous Q12H     Infusions: . sodium chloride 10 mL/hr (04/18/17 0831)  . sodium chloride    . sodium chloride    . norepinephrine (LEVOPHED) Adult infusion 15 mcg/min (04/18/17 0831)  . propofol (DIPRIVAN) infusion 40 mcg/kg/min (04/18/17 0852)     PRN Medications:  sodium chloride, sodium chloride, Place/Maintain arterial line **AND** sodium chloride, bisacodyl, fentaNYL (SUBLIMAZE) injection, midazolam, ondansetron (ZOFRAN) IV, sodium chloride flush  Previous Imaging  Nuc stress 09-05-16 showed no significant ischemia; est LVEF 47%  RHC 09-24-16 RA 25/32 mean 26mHg RV 814mg w/ EDP 2566m PA 80/35 with mean 90m45mPCW 11/15 with mean of 10mm22mO 6.5 L/min, CI 2.5, PVR 6.1 Wood units  LHC 01-30-12 LM nl, 50% mLAD, CTO ostial LCx, 80% mRCA Grafts: LIMA-LAD atrentic distally, SVG-OM1 patent, filling OM2 retrograde with ~95% in OM2  (small vessel not amenable for intervention), SVG-PDA patent with LI  Patient Profile   SolemManford Strong 76 y.29 male with h/o CAD s/p CABG in 2008 at [facility] (LIMA-LAD known to be atretic, SVG-OM1, SVG-PDA), R sided HF w/ preserved LVEF, pulm HTN (severe on RHC Jan 2018), chronic renal failure, parox afib, HTN, dyslipedemia, DM2.  [place], [place]F, and acute respiratory failure.  Assessment/Plan   1. R-sided HF/pulm HTN: - Severe RV dysfunction on recent Echo as above, though with preserved LVEF - Renal has been consulted. No IV diuresis ordered overnight with worsening renal function.   2. Acute respiratory failure - Intubated and sedated. Appreciate CCM support.   3. 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 Will need CVVHD.   4. CAD: h/o CABG. There do not appear to be any acute issues in regard to active CAD or ischemia at this time.  - LVEF is preserved. LVEF is preserved  5. OHS/pulm HTN:  - Appreciate CCM support.   6. Afib:  - Possible PAF at OSH. Will follow on tele.   7. Hyperkalemia - In setting of renal failure.    Length of Stay: 1   MichaAnnamaria Helling6/2018, 9:38 AM  Advanced Heart Failure Team Pager [phone]; 7a - 4p)  Please contact [facility] for night-coverage after hours (4p -7a ) and weekends on [email or web address]    Agree with above.  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 Now on vent. Swan numbers obtained personally CVP very high but PCWP only 16. Cardiac output ok.   On exam intubated and sedated RIJ swan Cor distant RRR Lungs decreased anteriorly Ab obese soft NT Ext warm mild edema.   He has multisystem organ failure in setting of end-stage cor pulmonale. 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 Will attempt trial of CVVHD but not sure he will be able to tolerate long-term HD with his degree of RV failure. Suspect RV  Failure related to OSA/OHS but with low volts on ecg amyloid also a possibility. Will check echo.  CRITICAL CARE Performed by: BensiGlori Bickersal critical  care time: 35 minutes  Critical care time was exclusive of separately billable procedures and treating other patients.  Critical care was necessary to treat or prevent imminent or life-threatening deterioration.  Critical care was time spent personally by me (independent of midlevel providers or residents) on the following activities: development of treatment plan with patient and/or surrogate as well as nursing, discussions with consultants, evaluation of patient's response to treatment, examination of patient, obtaining history from patient or surrogate, ordering and performing treatments and interventions, ordering and review of laboratory studies, ordering and review of radiographic studies, pulse oximetry and re-evaluation of patient's condition.    Discussed with CCM and family at bedside. Prognosis very guarded.   William Bickers, MD  3:07 PM

## 2017-04-18 NOTE — Progress Notes (Signed)
CRITICAL VALUE ALERT  Critical Value:  troponin  Date & Time Notied:  04/18/17 @ 0555  Provider Notified: MD Kasa  Orders Received/Actions taken: No new orders at this time.

## 2017-04-18 NOTE — Procedures (Signed)
Arterial Catheter Insertion Procedure Note Nanine MeansSolemon Robarts [id number] Mar 22, 1941  Procedure: Insertion of Arterial Catheter  Indications: Blood pressure monitoring and Frequent blood sampling  Procedure Details Consent: Unable to obtain consent because of altered level of consciousness. Time Out: Verified patient identification, verified procedure, site/side was marked, verified correct patient position, special equipment/implants available, medications/allergies/relevent history reviewed, required imaging and test results available.  Performed  Maximum sterile technique was used including antiseptics, cap, gloves, gown, hand hygiene, mask and sheet. Skin prep: Chlorhexidine; local anesthetic administered 20 gauge catheter was inserted into left radial artery using the Seldinger technique.  Evaluation Blood flow good; BP tracing good. Complications: No apparent complications.   Tacy Learnurriff, Amalya Salmons E 04/18/2017

## 2017-04-18 NOTE — Procedures (Signed)
Hemodialysis Catheter Insertion Procedure Note William MeansSolemon Strong [id number] [birth year]  Procedure: Insertion of Hemodialysis Catheter Indications: Hemodialysis  Procedure Details Consent: Risks of procedure as well as the alternatives and risks of each were explained to the (patient/caregiver).  Consent for procedure obtained.  Time Out: Verified patient identification, verified procedure, site/side was marked, verified correct patient position, special equipment/implants available, medications/allergies/relevent history reviewed, required imaging and test results available.  Performed  Maximum sterile technique was used including antiseptics, cap, gloves, gown, hand hygiene, mask and sheet.  Skin prep: Chlorhexidine; local anesthetic administered  A Trialysis HD catheter was placed in the left internal jugular vein to 20 cm using the Seldinger technique.  Sutured in place, biopatch applied, covered with sterile dressing.   Evaluation Blood flow good Complications: No apparent complications Patient did tolerate procedure well. Chest X-ray ordered to verify placement.  CXR: pending.   Procedure performed under direct supervision of Dr. Jamison NeighborNestor and with ultrasound guidance for real time vessel cannulation.     Canary BrimBrandi Ollis, NP-C [facility] Pgr: [phone] or [phone] 04/18/2017, 1:51 PM

## 2017-04-19 ENCOUNTER — Inpatient Hospital Stay (HOSPITAL_COMMUNITY): Payer: Medicare Other

## 2017-04-19 DIAGNOSIS — I50813 Acute on chronic right heart failure: Secondary | ICD-10-CM

## 2017-04-19 DIAGNOSIS — I482 Chronic atrial fibrillation: Secondary | ICD-10-CM

## 2017-04-19 DIAGNOSIS — I2609 Other pulmonary embolism with acute cor pulmonale: Secondary | ICD-10-CM

## 2017-04-19 LAB — CBC WITH DIFFERENTIAL/PLATELET
BASOS ABS: 0 10*3/uL (ref 0.0–0.1)
BASOS PCT: 0 %
Eosinophils Absolute: 0.1 10*3/uL (ref 0.0–0.7)
Eosinophils Relative: 1 %
HEMATOCRIT: 31 % — AB (ref 39.0–52.0)
HEMOGLOBIN: 10 g/dL — AB (ref 13.0–17.0)
Lymphocytes Relative: 19 %
Lymphs Abs: 1.6 10*3/uL (ref 0.7–4.0)
MCH: 26.2 pg (ref 26.0–34.0)
MCHC: 32.3 g/dL (ref 30.0–36.0)
MCV: 81.4 fL (ref 78.0–100.0)
Monocytes Absolute: 1 10*3/uL (ref 0.1–1.0)
Monocytes Relative: 12 %
NEUTROS ABS: 5.8 10*3/uL (ref 1.7–7.7)
NEUTROS PCT: 68 %
Platelets: 136 10*3/uL — ABNORMAL LOW (ref 150–400)
RBC: 3.81 MIL/uL — ABNORMAL LOW (ref 4.22–5.81)
RDW: 16.7 % — ABNORMAL HIGH (ref 11.5–15.5)
WBC: 8.5 10*3/uL (ref 4.0–10.5)

## 2017-04-19 LAB — RENAL FUNCTION PANEL
ALBUMIN: 3 g/dL — AB (ref 3.5–5.0)
ALBUMIN: 3 g/dL — AB (ref 3.5–5.0)
ANION GAP: 12 (ref 5–15)
ANION GAP: 10 (ref 5–15)
BUN: 82 mg/dL — ABNORMAL HIGH (ref 6–20)
BUN: 63 mg/dL — ABNORMAL HIGH (ref 6–20)
CALCIUM: 7.4 mg/dL — AB (ref 8.9–10.3)
CALCIUM: 7.4 mg/dL — AB (ref 8.9–10.3)
CO2: 22 mmol/L (ref 22–32)
CO2: 24 mmol/L (ref 22–32)
Chloride: 105 mmol/L (ref 101–111)
Chloride: 104 mmol/L (ref 101–111)
Creatinine, Ser: 4.06 mg/dL — ABNORMAL HIGH (ref 0.61–1.24)
Creatinine, Ser: 3.07 mg/dL — ABNORMAL HIGH (ref 0.61–1.24)
GFR calc non Af Amer: 13 mL/min — ABNORMAL LOW (ref >60)
GFR, EST AFRICAN AMERICAN: 15 mL/min — AB (ref >60)
GFR, EST AFRICAN AMERICAN: 22 mL/min — AB (ref >60)
GFR, EST NON AFRICAN AMERICAN: 19 mL/min — AB (ref >60)
GLUCOSE: 163 mg/dL — AB (ref 65–99)
Glucose, Bld: 136 mg/dL — ABNORMAL HIGH (ref 65–99)
PHOSPHORUS: 5 mg/dL — AB (ref 2.5–4.6)
PHOSPHORUS: 4.8 mg/dL — AB (ref 2.5–4.6)
POTASSIUM: 4.6 mmol/L (ref 3.5–5.1)
Potassium: 4.3 mmol/L (ref 3.5–5.1)
SODIUM: 139 mmol/L (ref 135–145)
SODIUM: 138 mmol/L (ref 135–145)

## 2017-04-19 LAB — GLUCOSE, CAPILLARY
GLUCOSE-CAPILLARY: 135 mg/dL — AB (ref 65–99)
GLUCOSE-CAPILLARY: 142 mg/dL — AB (ref 65–99)
GLUCOSE-CAPILLARY: 149 mg/dL — AB (ref 65–99)
GLUCOSE-CAPILLARY: 155 mg/dL — AB (ref 65–99)
Glucose-Capillary: 119 mg/dL — ABNORMAL HIGH (ref 65–99)

## 2017-04-19 LAB — POCT I-STAT 3, ART BLOOD GAS (G3+)
ACID-BASE DEFICIT: 2 mmol/L (ref 0.0–2.0)
BICARBONATE: 23.3 mmol/L (ref 20.0–28.0)
O2 SAT: 93 %
PH ART: 7.396 (ref 7.350–7.450)
PO2 ART: 67 mmHg — AB (ref 83.0–108.0)
TCO2: 24 mmol/L (ref 0–100)
pCO2 arterial: 37.7 mmHg (ref 32.0–48.0)

## 2017-04-19 LAB — PROTIME-INR
INR: 3.43
Prothrombin Time: 35.4 seconds — ABNORMAL HIGH (ref 11.4–15.2)

## 2017-04-19 LAB — T4, FREE: FREE T4: 1 ng/dL (ref 0.61–1.12)

## 2017-04-19 LAB — TSH: TSH: 1.995 u[IU]/mL (ref 0.350–4.500)

## 2017-04-19 LAB — HEMOGLOBIN A1C
Hgb A1c MFr Bld: 6.4 % — ABNORMAL HIGH (ref 4.8–5.6)
Mean Plasma Glucose: 137 mg/dL

## 2017-04-19 LAB — MAGNESIUM: Magnesium: 2.3 mg/dL (ref 1.7–2.4)

## 2017-04-19 MED ORDER — PANTOPRAZOLE SODIUM 40 MG PO PACK
40.0000 mg | PACK | Freq: Every day | ORAL | Status: DC
  Administered 2017-04-19 – 2017-04-22 (×4): 40 mg
  Filled 2017-04-19 (×3): qty 20

## 2017-04-19 MED ORDER — SENNOSIDES 8.8 MG/5ML PO SYRP
5.0000 mL | ORAL_SOLUTION | Freq: Two times a day (BID) | ORAL | Status: DC
  Administered 2017-04-19 – 2017-04-21 (×5): 5 mL
  Filled 2017-04-19 (×7): qty 5

## 2017-04-19 NOTE — Progress Notes (Addendum)
PULMONARY / CRITICAL CARE MEDICINE   Name: William Strong MRN: [id number] DOB: [birth year]    ADMISSION DATE:  May 01, 2017   CONSULTATION DATE:  05/01/17  REFERRING MD:  Dr. Gala Romney   CHIEF COMPLAINT:  Respiratory Distress   HISTORY OF PRESENT ILLNESS:  76 y.o. male with past medical history of atrial fibrillation on systemic anticoagulation with Coumadin, OSA on CPAP therapy, chronic hypoxic respiratory failure on 4 L/m, chronic renal failure, CAD s/p CABG, DM, Dyslipidemia, HTN, Pulmonary HTN, and cor pulmonale. He presented to outside hospital with 3 days of progressive dyspnea. Patient aggressively diuresed with Bumex drip and Primacor drip. With continued worsening in clinical status as well as worsening renal function, hypercapnia, and respiratory acidosis patient was transferred to [facility] for further medical management. PCCM asked to consult for respiratory failure.  SUBJECTIVE:  No change in status Continues on CVVH with -200/hr Continues on levo  REVIEW OF SYSTEMS:  Unable to obtain given intubated status.  VITAL SIGNS: BP 126/66   Pulse 99   Temp (!) 96.7 F (35.9 C) (Axillary)   Resp (!) 28   Ht 5\' 10"  (1.778 m)   Wt (!) 336 lb 6.8 oz (152.6 kg)   SpO2 97%   BMI 48.27 kg/m   HEMODYNAMICS: PAP: (53-67)/(28-45) 61/33 CVP:  [1 mmHg-26 mmHg] 16 mmHg PCWP:  [16 mmHg] 16 mmHg CO:  [7 L/min] 7 L/min CI:  [2.7 L/min/m2] 2.7 L/min/m2  VENTILATOR SETTINGS: Vent Mode: PRVC FiO2 (%):  [60 %-80 %] 60 % Set Rate:  [28 bmp] 28 bmp Vt Set:  [580 mL] 580 mL PEEP:  [14 cmH20] 14 cmH20 Plateau Pressure:  [29 cmH20-30 cmH20] 29 cmH20  INTAKE / OUTPUT: I/O last 3 completed shifts: In: 2753.9 [I.V.:1811.3; NG/GT:782.7; IV Piggyback:160] Out: 4222 [Urine:2050; Emesis/NG output:200; Other:1972]  PHYSICAL EXAMINATION: Gen:      No acute distress, obese HEENT:  EOMI, sclera anicteric, ETT in place Neck:     No masses; no thyromegaly Lungs:    Clear to auscultation  bilaterally; normal respiratory effort CV:         Regular rate and rhythm; no murmurs Abd:      + bowel sounds; soft, non-tender; no palpable masses, no distension Ext:    No edema; adequate peripheral perfusion Skin:      Warm and dry; no rash Neuro: Sedated, unresponsive  LABS:  BMET  Recent Labs Lab 04/18/17 0638 04/18/17 1621 04/19/17 0319  NA 139 140 139  K 5.6* 4.5 4.3  CL 107 106 105  CO2 21* 20* 22  BUN 115* 114* 82*  CREATININE 6.20* 5.93* 4.06*  GLUCOSE 151* 94 136*    Electrolytes  Recent Labs Lab 2017/05/01 2322  04/18/17 0638 04/18/17 1621 04/19/17 0319  CALCIUM 7.1*  < > 7.2* 7.3* 7.4*  MG 1.9  --   --   --  2.3  PHOS  --   --   --  6.0* 5.0*  < > = values in this interval not displayed.  CBC  Recent Labs Lab 05/01/17 2322 04/18/17 0506 04/19/17 0319  WBC 8.4  --  8.5  HGB 10.3* 10.2* 10.0*  HCT 32.2* 30.0* 31.0*  PLT 131*  --  136*    Coag's  Recent Labs Lab 05-01-2017 2322  APTT 45*  INR 2.98    Sepsis Markers  Recent Labs Lab May 01, 2017 2322 04/18/17 0319 04/18/17 1026  LATICACIDVEN 0.8 1.0 0.55    ABG  Recent Labs Lab 04/18/17 1031 04/18/17 1849  04/19/17 0408  PHART 7.338* 7.355 7.396  PCO2ART 40.1 37.9 37.7  PO2ART 65.0* 69.0* 67.0*    Liver Enzymes  Recent Labs Lab 2017-04-19 2322 04/18/17 1621 04/19/17 0319  AST 16  --   --   ALT 11*  --   --   ALKPHOS 84  --   --   BILITOT 0.9  --   --   ALBUMIN 3.4* 3.1* 3.0*    Cardiac Enzymes  Recent Labs Lab 04/18/17 0451 04/18/17 0638  TROPONINI 0.05* 0.06*    Glucose  Recent Labs Lab 04/18/17 1158 04/18/17 1616 04/18/17 2004 04/19/17 0000 04/19/17 0407 04/19/17 0751  GLUCAP 91 104* 162* 149* 142* 119*    Imaging US Renal  Result Date: 04/18/2017 CLINICAL DATA:  Renal failure EXAM: RENAL ULTRASOUND COMPARISON:  None. FINDINGS: Right Kidney: Length: 11.3 cm. Echogenicity and renal cortical thickness are within normal limits. No perinephric fluid  or hydronephrosis visualized. There is a cyst arising from the upper pole of the right kidney measuring 3.1 x 2.7 x 2.3 cm. No sonographically demonstrable calculus or ureterectasis. Left Kidney: Unable to visualize due to overlying gas. No obvious lesion seen by ultrasound in the left perinephric region. Bladder: Decompressed with Foley catheter and could not be visualized IMPRESSION: Unable to visualize left kidney due to overlying gas and limitation in patient motion or ability to cooperate. Cyst arising from upper pole right kidney. Right kidney otherwise appears unremarkable. Urinary bladder decompressed and unable to assess. Electronically Signed   By: Bretta Bang III M.D.   On: 04/18/2017 11:59   Dg Chest Port 1 View  Result Date: 04/19/2017 CLINICAL DATA:  Intubation.  Respiratory failure EXAM: PORTABLE CHEST 1 VIEW COMPARISON:  Yesterday FINDINGS: Cardiomegaly that is stable. Status post CABG. Left IJ central line with tip at the SVC brachiocephalic confluence. Endotracheal tube tip at the clavicular heads. Diffuse interstitial opacity with haziness at the left more than right base from atelectasis or pleural fluid. No visible pneumothorax. IMPRESSION: 1. Improved pulmonary edema. 2. Stable positioning of tubes and central line. Electronically Signed   By: Marnee Spring M.D.   On: 04/19/2017 07:08   Dg Chest Port 1 View  Addendum Date: 04/18/2017   ADDENDUM REPORT: 04/18/2017 14:59 ADDENDUM: Impression should read: No pneumothorax following central venous catheter placement Electronically Signed   By: Alcide Clever M.D.   On: 04/18/2017 14:59   Result Date: 04/18/2017 CLINICAL DATA:  Check endotracheal tube placement and central venous line placement EXAM: PORTABLE CHEST 1 VIEW COMPARISON:  04/18/2017 FINDINGS: New left central venous line is noted with catheter tip in the proximal superior vena cava. Swan-Ganz catheter is noted in the right pulmonary outflow tract. An endotracheal tube and  nasogastric catheter are in satisfactory position. No pneumothorax is noted. Vascular congestion is again noted and increasing. IMPRESSION: Pneumothorax following central venous catheter placement. Increase in the degree of congestive failure. Electronically Signed: By: Alcide Clever M.D. On: 04/18/2017 14:15   Dg Chest Port 1 View  Result Date: 04/18/2017 CLINICAL DATA:  Acute respiratory failure, hypoxia EXAM: PORTABLE CHEST 1 VIEW COMPARISON:  04/18/2017 FINDINGS: Endotracheal tube and NG tube are stable. Repositioning of the Swan-Ganz catheter with the tip in the right lower lobe pulmonary artery. Cardiomegaly with vascular congestion and mild pulmonary edema, stable. Possible small layering effusions. IMPRESSION: No significant change in the CHF pattern. Suspect small layering effusions. Electronically Signed   By: Charlett Nose M.D.   On: 04/18/2017 11:02  STUDIES:  PORT CXR 8/16:  Previously reviewed by me. Endotracheal tube in goodpositon. Enteric feeding tube coursing below the diaphragm. Bilateral hilar fullness. Elevation of left hemidiaphragm. Pulmonary arterial catheter appears slightly coiled. RENAL U/S 8/16:  Unable to visualize left kidney due to overlying gas and limitation in patient motion or ability to cooperate.  Cyst arising from upper pole right kidney. Right kidney otherwise appears unremarkable. Urinary bladder decompressed and unable to assess. VENOUS DUPLEX LLE 8/16:  No DVT or SVT. TTE 8/16:  LV moderately dilated with EF 55-60%. Cannot exclude wall motion abnormality although none seen. Insufficient to assess diastolic function. LA moderately dilated & RA severely dilated. RV not well visualized but overall severely dilated with severely reduced systolic function. Pulmonary artery systolic pressure 61 mmHg. Aortic valve poorly visualized but no stenosis or regurgitation seen. Aortic root normal in size. No mitral stenosis or regurgitation. Paradoxical septal motion. Mild  pulmonic regurgitation with poorly visualized valve. Moderate tricuspid regurgitation. No pericardial effusion. PORT CXR 8/17: Enteric feeding tube coursing below diaphragm. Endotracheal tube in acceptable position. Left internal jugular central venous catheter in good position. Right central venous catheter removed. I have reviewed the images personally  MICROBIOLOGY: MRSA PCR 8/15:  Negative   ANTIBIOTICS: None.   SIGNIFICANT EVENTS: 8/15 - Transferred from OSH & intubated w/ worsening respiratory status & encephalopathy 8/16 - PA catheter placed & then discontinued w/ PCWP 14. Started on CVVHD. 8/17 - Pulling 100-150cc on CVVHD & off pressor.  LINES/TUBES: R IJ PA CATHETER 8/16 (out same day) OETT 8/16 >>> L IJ TEMP HD CATHETER 8/16 >>> L RADIAL ART LINE >>> OGT 8/16 >>> FOLEY 8/16 >>> PIV  ASSESSMENT / PLAN: 76 y.o. male with pulmonary hypertension, decompensated rt heart failure, acute cor pulmonale, acute on chronic renal failure, and acute on chronic hypoxic respiratory failure.  On vol removal via CVVH  PULMONARY A: Acute on chronic hypoxic respiratory failure: Multifactorial in the setting of pulmonary edema and pulmonary hypertension. Uses 4 L/m at home. Acute hypercarbic respiratory failure: Question possible underlying COPD. Pulmonary hypertension: Multifactorial. OSA: On CPAP therapy at home. Possible COPD: Chronic tobacco use. Tobacco use disorder: Quit smoking tobacco 7 months prior to admission.  P:   Continue full vent support. No plans for SBTs today give the high PEEP FiO2 requirements Adjust PEEP/FiO2 to maintain sats Follow chest x-ray Continue revatio  CARDIOVASCULAR A:  Acute on chronic congestive heart failure: History of diastolic congestive heart failure.  Cor pulmonale: Question acute versus chronic. H/O CAD s/p CABG, HTN, HLD, paroxysmal atrial fibrillation   P:  Continue heparin drip Wean levo off as tolearted Volume removal via  CVVH  RENAL A:   Acute on chronic renal failure stage IV: Multifactorial. Known history of urinary retention. Hyperkalemia: Mild. Improving. S/P Kayexalate. History of urinary retention  Metabolic acidosis: Mild.  P:   Continue CVVH Follow urine output  GASTROINTESTINAL A:   No acute issues.   P:   Nothing by mouth  Starting senna twice a day  HEMATOLOGIC A:   Anemia: Likely secondary to chronic disease. No evidence of active bleeding. Coagulopathy: Secondary to chronic anticoagulation with Coumadin.  Thrombocytopenia: Mild.  P:  Follow CBC  INFECTIOUS A:   No acute issues.  P:   Observe off antibiotics  ENDOCRINE A:   DM:  Glucose controlled.  P:   SSI coverage  NEUROLOGIC A:   Acute encephalopathy: Likely multifactorial from hypoxia and hypercarbia. Sedation on ventilator   P:  RASS goal: 0 to -1 Propofol gtt Fentanyl, versed PRN  Prophylaxis:  Heparin gtt protonix Diet: Tube feeds Code Status:  Full code Disposition:  Remain in ICU Family Update: Daughter updated 8/18 at bedside  The patient is critically ill with multiple organ system failure and requires high complexity decision making for assessment and support, frequent evaluation and titration of therapies, advanced monitoring, review of radiographic studies and interpretation of complex data.   Critical Care Time devoted to patient care services, exclusive of separately billable procedures, described in this note is 35 minutes.   Chilton Greathouse MD [facility] Pager [phone] If no answer or after 3pm call: [phone] 04/20/2017, 8:30 AM

## 2017-04-19 NOTE — Progress Notes (Signed)
ANTICOAGULATION CONSULT NOTE - Initial Consult  Pharmacy Consult for heparin Indication: atrial fibrillation  Allergies  Allergen Reactions  . Oxycontin [Oxycodone Hcl] Shortness Of Breath, Nausea And Vomiting and Other (See Comments)    Reported by patient    Patient Measurements: Height: 5\' 10"  (177.8 cm) Weight: (!) 336 lb 6.8 oz (152.6 kg) IBW/kg (Calculated) : 73 Heparin Dosing Weight: 115 kg  Vital Signs: Temp: 96.5 F (35.8 C) (08/17 1200) Temp Source: Axillary (08/17 1200) Pulse Rate: 99 (08/17 1200)  Labs:  Recent Labs  20-Apr-2017 2322  04/18/17 0451 04/18/17 0506 04/18/17 0638 04/18/17 1621 04/19/17 0319 04/19/17 1030  HGB 10.3*  --   --  10.2*  --   --  10.0*  --   HCT 32.2*  --   --  30.0*  --   --  31.0*  --   PLT 131*  --   --   --   --   --  136*  --   APTT 45*  --   --   --   --   --   --   --   LABPROT 31.6*  --   --   --   --   --   --  35.4*  INR 2.98  --   --   --   --   --   --  3.43  CREATININE 6.23*  < > 6.13*  --  6.20* 5.93* 4.06*  --   TROPONINI  --   --  0.05*  --  0.06*  --   --   --   < > = values in this interval not displayed.  Estimated Creatinine Clearance: 23.7 mL/min (A) (by C-G formula based on SCr of 4.06 mg/dL (H)).   Medical History: Past Medical History:  Diagnosis Date  . A-fib (HCC)   . CAD (coronary artery disease)   . Diabetes (HCC)   . Dyslipidemia   . HTN (hypertension)   . Hx of CABG   . Pulmonary HTN (HCC)   . Right-sided heart failure (HCC)     Medications:  Infusions:  . sodium chloride Stopped (04/18/17 1800)  . sodium chloride    . sodium chloride    . norepinephrine (LEVOPHED) Adult infusion Stopped (04/19/17 0400)  . dialysis replacement fluid (prismasate) 200 mL/hr at 04/18/17 1542  . dialysis replacement fluid (prismasate) 300 mL/hr at 04/19/17 0909  . dialysate (PRISMASATE) 2,000 mL/hr at 04/19/17 1232  . propofol (DIPRIVAN) infusion 50 mcg/kg/min (04/19/17 1044)  . sodium chloride       Assessment: 76 yo male on chronic Coumadin for afib, currently on hold.  Pharmacy asked to begin IV heparin once INR < 2.  Today's INR up to 3.43.  Has not had any Coumadin since admission.  Goal of Therapy:  Heparin level 0.3-0.7 units/ml Monitor platelets by anticoagulation protocol: Yes   Plan:  1. Daily INR. 2. Will start IV heparin when INR < 2.  Tad Moore, BCPS  Clinical Pharmacist Pager [phone]  04/19/2017 12:56 PM

## 2017-04-19 NOTE — Progress Notes (Signed)
[facility] Daily Rounding Note  Brief HPI: 76 y/o M transferred from a hospital in [place], [place] admitted here overnight 8/15 with R-sided HF, acute respiratory failure, pHTN and acute on chronic renal failure. BL Cr 3-4, he has known OSA on CPAP and is on 4L O2 chronically at home. IV diuresis limited by renal function. Intubated and started on CRRT 8/16.   INTERVAL EVENTS: Remains intubated. On CRRT pulling ~100-282ms'hr. Off pressors as of 1 am. Has 2L recorded UOP and 2L recorded output from CRRT.  Weight 336, down from 339 yest.   Creatinine, Ser  Date/Time Value Ref Range Status  04/19/2017 03:19 AM 4.06 (H) 0.61 - 1.24 mg/dL Final  04/18/2017 04:21 PM 5.93 (H) 0.61 - 1.24 mg/dL Final  04/18/2017 06:38 AM 6.20 (H) 0.61 - 1.24 mg/dL Final  04/18/2017 04:51 AM 6.13 (H) 0.61 - 1.24 mg/dL Final  04/18/2017 03:18 AM 6.22 (H) 0.61 - 1.24 mg/dL Final  04/20/2017 11:22 PM 6.23 (H) 0.61 - 1.24 mg/dL Final   Inpatient medications: . chlorhexidine gluconate (MEDLINE KIT)  15 mL Mouth Rinse BID  . Chlorhexidine Gluconate Cloth  6 each Topical Daily  . feeding supplement (PRO-STAT SUGAR FREE 64)  60 mL Per Tube QID  . feeding supplement (VITAL HIGH PROTEIN)  1,000 mL Per Tube Q24H  . insulin aspart  2-6 Units Subcutaneous Q4H  . ipratropium-albuterol  3 mL Nebulization Q6H  . mouth rinse  15 mL Mouth Rinse 10 times per day  . pantoprazole (PROTONIX) IV  40 mg Intravenous Q24H  . sildenafil  20 mg Oral Q8H  . sodium chloride flush  10-40 mL Intracatheter Q12H  . sodium chloride flush  3 mL Intravenous Q12H  Continuous Medications: . sodium chloride Stopped (04/18/17 1800)  . sodium chloride    . sodium chloride    . norepinephrine (LEVOPHED) Adult infusion Stopped (04/19/17 0400)  . dialysis replacement fluid (prismasate) 200 mL/hr at 04/18/17 1542  . dialysis replacement fluid (prismasate) 300 mL/hr at 04/18/17 1542  . dialysate (PRISMASATE) 2,000 mL/hr at 04/19/17 0700  . propofol  (DIPRIVAN) infusion 50 mcg/kg/min (04/19/17 0700)  . sodium chloride     Physical Exam:  Blood pressure 126/66, pulse (!) 101, temperature 97.7 F (36.5 C), temperature source Oral, resp. rate (!) 28, height _0  (1.778 m), weight (!) 336 lb 6.8 oz (152.6 kg), SpO2 96 %.  Gen: Morbidly obese male. Sedated. On vent.Trendelenburg Lines/tubes: ETT, OG, RIJ. Skin: no rash, cyanosis Neck: +JVD. Thick neck. Chest: No obvious murmur. Tachycardic. Abdomen: soft, not distended. Ext: Warm and perfused. 2+ BL LE edema. Presacral edema. Neuro: RAAS -2   Labs: Renal Panel:  Recent Labs Lab 05/03/2017 2322 04/18/17 0318 04/18/17 0451 04/18/17 0506 04/18/17 0638 04/18/17 1621 04/19/17 0319  NA 140 140 139 141 139 140 139  K 6.0* 6.1* 5.8* 5.6* 5.6* 4.5 4.3  CL 108 108 107  --  107 106 105  CO2 22 23 21*  --  21* 20* 22  GLUCOSE 149* 188* 172* 160* 151* 94 136*  BUN 117* 116* 116*  --  115* 114* 82*  CREATININE 6.23* 6.22* 6.13*  --  6.20* 5.93* 4.06*  CALCIUM 7.1* 7.1* 7.0*  --  7.2* 7.3* 7.4*  PHOS  --   --   --   --   --  6.0* 5.0*   Liver Function Tests:  Recent Labs Lab 04/27/2017 2322 04/18/17 1621 04/19/17 0319  AST 16  --   --  ALT 11*  --   --   ALKPHOS 84  --   --   BILITOT 0.9  --   --   PROT 6.6  --   --   ALBUMIN 3.4* 3.1* 3.0*   No results for input(s): LIPASE, AMYLASE in the last 168 hours. No results for input(s): AMMONIA in the last 168 hours. CBC:  Recent Labs Lab 04/14/2017 2322 04/18/17 0506 04/19/17 0319  WBC 8.4  --  8.5  NEUTROABS 6.8  --  5.8  HGB 10.3* 10.2* 10.0*  HCT 32.2* 30.0* 31.0*  MCV 85.0  --  81.4  PLT 131*  --  136*   PT/INR: _0 (inr:5) Cardiac Enzymes: )  Recent Labs Lab 04/18/17 0451 04/18/17 0638  TROPONINI 0.05* 0.06*   CBG:  Recent Labs Lab 04/18/17 1158 04/18/17 1616 04/18/17 2004 04/19/17 0000 04/19/17 0407  GLUCAP 91 104* 162* 149* 142*   Iron Studies: No results for input(s): IRON, TIBC,  TRANSFERRIN, FERRITIN in the last 168 hours.  Xrays/Other Studies:  ECHO 04/16/17 ([place]) with LVEF 55-60%, mildly dilated RV w/ severe RV systolic dysfunction, mildly dilated LA and severely dilated RA with mild MAC w/ mild MR, TR and mild pulmonary HTN with estimated PAS 43 mmHg.   US Renal - Result Date: 04/18/2017  IMPRESSION: Unable to visualize left kidney due to overlying gas and limitation in patient motion or ability to cooperate. Cyst arising from upper pole right kidney. Right kidney otherwise appears unremarkable. Urinary bladder decompressed and unable to assess. Electronically Signed   By: Lowella Grip III M.D.   On: 04/18/2017 11:59   Dg Chest Port 1 View - Result Date: 04/19/2017 IMPRESSION: 1. Improved pulmonary edema. 2. Stable positioning of tubes and central line. Electronically Signed   By: Monte Fantasia M.D.   On: 04/19/2017 07:08   Dg Chest Port 1 View - Result Date: 04/18/2017 IMPRESSION: Pneumothorax following central venous catheter placement. Increase in the degree of congestive failure.Addendum Date: 04/18/2017   ADDENDUM REPORT: 04/18/2017 14:59 ADDENDUM: Impression should read: No pneumothorax following central venous catheter placement    Dg Chest Port 1 View - Result Date: 04/13/2017 IMPRESSION: Vascular congestion and mild cardiomegaly. Suspect small right pleural effusion. Diffusely increased interstitial markings raise concern for pulmonary edema.   Background: This is a critically-ill 76 y/o M here from outside hospital with right heart failure, acute (oliguric) on chronic renal failure, acute on chronic respiratory failure and massive volume overload. He did not respond well to aggressive IV diuresis. CRRT initiated 04/18/17.  Assessment/Recommendations  1. 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 Here w/ oliguric renal failure most likely secondary to cardiorenal syndrome/shock. Now with good UOP without IV  diuresis. Cr 6.23 on admission and hyperkalemic to 6.1 however both improved on CRRT. He remains significantly volume overloaded.   1. CRRT 2. I&O's, daily weights 3. Daily renal function panels, BID 2. Right Heart Failure, preserved LVEF: Severe RV dysfunction noted on recent echo. Did not respond to aggressive diureses at outside hospital.  1. Per HF 3. Acute on Chronic Respiratory Failure, pHTN: On 4L O2 via [place] at home at baseline. Currently intubated. Worsening resp status no doubt secondary to volume status in setting of his chronic respiratory failure 1. CCM on, appreciate 2. Vent 3. Sildenafil 25m TID 4. Hypotension/Shock: Cardiogenic.  1. OFF Levophed. 2. Monitor hemodynamics closely 5. Hyperkalemia, Hyperphosphatemia: K 6.1 on adm, now 4.3 after CRRT and Kayexalate.  Received calcium gluconate. Phos 5.0.  1. Keep an eye on phos, should continue to improve with RRT 6. CAD with hx of CABG: Doesn't appear to be active CAD/ischemia currently. Monitor per cards 7. Atrial Fibrillation: ?On warfarin at home?  1. Will follow on telemetry 2. Heparin here.  Einar Gip, DO Internal Medicine - PGY2 [facility]  04/19/2017, 8:07 AM  I have seen and examined this patient and agree with plan and assessment in the above note with renal recommendations/intervention highlighted.  Pt has had spontaneous diuresis after initiating CVVHD with volume removal.  Unfortunately he has advanced CKD at baseline and with his severe Cor-pulmonale, he will not likely do well with IHD.  Will continue with CVVHDF for now, however would recommend Palliative care consult to help set goals/limits of care as dialysis will not fix his other co-morbidities, especially his Right sided CHF/pulmonary HTN or his chronic respiratory failure. Governor Rooks Laresha Bacorn,MD 04/19/2017 3:55 PM

## 2017-04-19 NOTE — Plan of Care (Signed)
Problem: Activity: Goal: Capacity to carry out activities will improve Outcome: Not Progressing Pt remains intubated and sedated  Problem: Cardiac: Goal: Ability to achieve and maintain adequate cardiopulmonary perfusion will improve Outcome: Progressing Within parameters  Problem: Education: Goal: Ability to demonstrate managment of disease process will improve Outcome: Not Progressing Education with family ongoing Goal: Ability to verbalize understanding of medication therapies will improve Outcome: Not Progressing Intubated and sedated

## 2017-04-19 NOTE — Progress Notes (Signed)
Advanced Heart Failure Rounding Note  Primary Cardiologist: [facility] HF: NEW (Dr. Haroldine Laws)   Subjective:    Transferred from [place], [place] yesterday with R sided HF, pulm HTN, ARF and acute respiratory failure.   Intubated 04/27/2017 with pH 7.1. Most recent  ph 7.396. Remains sedated on vent.  Trialysis cath placed 04/18/17 and started on CRRT.   Negative 2.2L. Tolerating CRRT at 100-150 cc an hour  Remains intubated and sedated. On FiO2 60% PEEP 14  Swan pulled.   Echo 04/18/17 LVEF 55-60%, Mod LAE, Severely dilated RV. RV function severely reduced. RA severely dilated.   Echo 04/16/17 (in [place]) - Mildly dilated LV w/o LVH, EF est 55-60%, mildly dilated RV w/ severe RV systolic dysfunction, mildly dilated LA, severely dilated RA, mild MAC w/ mild MR, mild TR, mild pulm HTN w/ est PAS ~60mHg  Objective:   Weight Range: (!) 336 lb 6.8 oz (152.6 kg) Body mass index is 48.27 kg/m.   Vital Signs:   Temp:  [97 F (36.1 C)-98.8 F (37.1 C)] 97.7 F (36.5 C) (08/17 0300) Pulse Rate:  [99-110] 101 (08/17 0700) Resp:  [24-32] 28 (08/17 0700) BP: (97-126)/(60-69) 126/66 (08/16 0930) SpO2:  [90 %-98 %] 96 % (08/17 0700) Arterial Line BP: (93-145)/(49-62) 93/52 (08/17 0700) FiO2 (%):  [70 %-80 %] 70 % (08/17 0400) Weight:  [336 lb 6.8 oz (152.6 kg)] 336 lb 6.8 oz (152.6 kg) (08/17 0500) Last BM Date:  (pta)  Weight change: Filed Weights   04/18/17 0000 04/18/17 0430 04/19/17 0500  Weight: (!) 340 lb (154.2 kg) (!) 339 lb (153.8 kg) (!) 336 lb 6.8 oz (152.6 kg)    Intake/Output:   Intake/Output Summary (Last 24 hours) at 04/19/17 0827 Last data filed at 04/19/17 0800  Gross per 24 hour  Intake          1775.63 ml  Output             4049 ml  Net         -2273.37 ml      Physical Exam    General: Intubated and sedated. HEENT: Normal Neck: Supple. JVP to jaw. Carotids 2+ bilat; no bruits. No thyromegaly or nodule noted. Cor: PMI nondisplaced.  RRR, No M/G/R noted Lungs: Mechanical breath sounds.  Abdomen: Soft, non-tender, non-distended, no HSM. No bruits or masses. +BS  Extremities: No cyanosis, clubbing, or rash. Trace to 1+ peripheral edema.   Neuro: Intubated and sedated.   Telemetry   Personally reviewed, NSR.   EKG    Personally reviewed, Probably NSR with RBBB. Low volts.   Labs    CBC  Recent Labs  04/13/2017 2322 04/18/17 0506 04/19/17 0319  WBC 8.4  --  8.5  NEUTROABS 6.8  --  5.8  HGB 10.3* 10.2* 10.0*  HCT 32.2* 30.0* 31.0*  MCV 85.0  --  81.4  PLT 131*  --  1299   Basic Metabolic Panel  Recent Labs  04/28/2017 2322  04/18/17 1621 04/19/17 0319  NA 140  < > 140 139  K 6.0*  < > 4.5 4.3  CL 108  < > 106 105  CO2 22  < > 20* 22  GLUCOSE 149*  < > 94 136*  BUN 117*  < > 114* 82*  CREATININE 6.23*  < > 5.93* 4.06*  CALCIUM 7.1*  < > 7.3* 7.4*  MG 1.9  --   --  2.3  PHOS  --   --  6.0* 5.0*  < > = values in this interval not displayed. Liver Function Tests  Recent Labs  04/06/2017 2322 04/18/17 1621 04/19/17 0319  AST 16  --   --   ALT 11*  --   --   ALKPHOS 84  --   --   BILITOT 0.9  --   --   PROT 6.6  --   --   ALBUMIN 3.4* 3.1* 3.0*   No results for input(s): LIPASE, AMYLASE in the last 72 hours. Cardiac Enzymes  Recent Labs  04/18/17 0451 04/18/17 0638  TROPONINI 0.05* 0.06*    BNP: BNP (last 3 results)  Recent Labs  04/14/2017 2322  BNP 296.5*    ProBNP (last 3 results) No results for input(s): PROBNP in the last 8760 hours.   D-Dimer No results for input(s): DDIMER in the last 72 hours. Hemoglobin A1C  Recent Labs  04/18/17 0318  HGBA1C 6.4*   Fasting Lipid Panel  Recent Labs  04/18/17 0317  TRIG 118   Thyroid Function Tests No results for input(s): TSH, T4TOTAL, T3FREE, THYROIDAB in the last 72 hours.  Invalid input(s): FREET3  Other results:   Imaging    US Renal  Result Date: 04/18/2017 CLINICAL DATA:  Renal failure EXAM: RENAL  ULTRASOUND COMPARISON:  None. FINDINGS: Right Kidney: Length: 11.3 cm. Echogenicity and renal cortical thickness are within normal limits. No perinephric fluid or hydronephrosis visualized. There is a cyst arising from the upper pole of the right kidney measuring 3.1 x 2.7 x 2.3 cm. No sonographically demonstrable calculus or ureterectasis. Left Kidney: Unable to visualize due to overlying gas. No obvious lesion seen by ultrasound in the left perinephric region. Bladder: Decompressed with Foley catheter and could not be visualized IMPRESSION: Unable to visualize left kidney due to overlying gas and limitation in patient motion or ability to cooperate. Cyst arising from upper pole right kidney. Right kidney otherwise appears unremarkable. Urinary bladder decompressed and unable to assess. Electronically Signed   By: Lowella Grip III M.D.   On: 04/18/2017 11:59   Dg Chest Port 1 View  Result Date: 04/19/2017 CLINICAL DATA:  Intubation.  Respiratory failure EXAM: PORTABLE CHEST 1 VIEW COMPARISON:  Yesterday FINDINGS: Cardiomegaly that is stable. Status post CABG. Left IJ central line with tip at the SVC brachiocephalic confluence. Endotracheal tube tip at the clavicular heads. Diffuse interstitial opacity with haziness at the left more than right base from atelectasis or pleural fluid. No visible pneumothorax. IMPRESSION: 1. Improved pulmonary edema. 2. Stable positioning of tubes and central line. Electronically Signed   By: Monte Fantasia M.D.   On: 04/19/2017 07:08   Dg Chest Port 1 View  Addendum Date: 04/18/2017   ADDENDUM REPORT: 04/18/2017 14:59 ADDENDUM: Impression should read: No pneumothorax following central venous catheter placement Electronically Signed   By: Inez Catalina M.D.   On: 04/18/2017 14:59   Result Date: 04/18/2017 CLINICAL DATA:  Check endotracheal tube placement and central venous line placement EXAM: PORTABLE CHEST 1 VIEW COMPARISON:  04/18/2017 FINDINGS: New left central  venous line is noted with catheter tip in the proximal superior vena cava. Swan-Ganz catheter is noted in the right pulmonary outflow tract. An endotracheal tube and nasogastric catheter are in satisfactory position. No pneumothorax is noted. Vascular congestion is again noted and increasing. IMPRESSION: Pneumothorax following central venous catheter placement. Increase in the degree of congestive failure. Electronically Signed: By: Inez Catalina M.D. On: 04/18/2017 14:15   Dg Chest [facility]  Result Date: 04/18/2017 CLINICAL DATA:  Acute respiratory failure, hypoxia EXAM: PORTABLE CHEST 1 VIEW COMPARISON:  04/18/2017 FINDINGS: Endotracheal tube and NG tube are stable. Repositioning of the Swan-Ganz catheter with the tip in the right lower lobe pulmonary artery. Cardiomegaly with vascular congestion and mild pulmonary edema, stable. Possible small layering effusions. IMPRESSION: No significant change in the CHF pattern. Suspect small layering effusions. Electronically Signed   By: Rolm Baptise M.D.   On: 04/18/2017 11:02     Medications:     Scheduled Medications: . chlorhexidine gluconate (MEDLINE KIT)  15 mL Mouth Rinse BID  . Chlorhexidine Gluconate Cloth  6 each Topical Daily  . feeding supplement (PRO-STAT SUGAR FREE 64)  60 mL Per Tube QID  . feeding supplement (VITAL HIGH PROTEIN)  1,000 mL Per Tube Q24H  . insulin aspart  2-6 Units Subcutaneous Q4H  . ipratropium-albuterol  3 mL Nebulization Q6H  . mouth rinse  15 mL Mouth Rinse 10 times per day  . pantoprazole (PROTONIX) IV  40 mg Intravenous Q24H  . sildenafil  20 mg Oral Q8H  . sodium chloride flush  10-40 mL Intracatheter Q12H  . sodium chloride flush  3 mL Intravenous Q12H    Infusions: . sodium chloride Stopped (04/18/17 1800)  . sodium chloride    . sodium chloride    . norepinephrine (LEVOPHED) Adult infusion Stopped (04/19/17 0400)  . dialysis replacement fluid (prismasate) 200 mL/hr at 04/18/17 1542  . dialysis  replacement fluid (prismasate) 300 mL/hr at 04/18/17 1542  . dialysate (PRISMASATE) 2,000 mL/hr at 04/19/17 0700  . propofol (DIPRIVAN) infusion 50 mcg/kg/min (04/19/17 0700)  . sodium chloride      PRN Medications: sodium chloride, sodium chloride, Place/Maintain arterial line **AND** sodium chloride, bisacodyl, fentaNYL (SUBLIMAZE) injection, heparin, midazolam, ondansetron (ZOFRAN) IV, sodium chloride, sodium chloride flush, sodium chloride flush  Previous Imaging  Nuc stress 09-05-16 showed no significant ischemia; est LVEF 47%  RHC 09-24-16 RA 25/32 mean 23mHg RV 868mg w/ EDP 2532m PA 80/35 with mean 44m63mPCW 11/15 with mean of 10mm59mO 6.5 L/min, CI 2.5, PVR 6.1 Wood units  LHC 01-30-12 LM nl, 50% mLAD, CTO ostial LCx, 80% mRCA Grafts: LIMA-LAD atrentic distally, SVG-OM1 patent, filling OM2 retrograde with ~95% in OM2 (small vessel not amenable for intervention), SVG-PDA patent with LI  Patient Profile   [employer] Jumper 76 y.61 male with h/o CAD s/p CABG in 2008 at [facility] (LIMA-LAD known to be atretic, SVG-OM1, SVG-PDA), R sided HF w/ preserved LVEF, pulm HTN (severe on RHC Jan 2018), chronic renal failure, parox afib, HTN, dyslipedemia, DM2.  [place], ARF, and acute respiratory failure.  Assessment/Plan   1. 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 Swan Luiz Blarepulled.   - Renal has been consulted. No IV diuresis ordered overnight with worsening renal function.   2. Acute respiratory failure - Intubated and sedated. Appreciate CCM support.    3. 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 Continue to follow.   4. CAD: h/o CABG.  - No acute issues or active CAD a this time.  - LVEF is preserved.   5. OHS/pulm HTN:  - Appreciate CCM support.      6. Afib:  - Per notes patient has chronic Afib and  has been on chronic coumadin.  - CHA2DS2/VASc is at least 4.  - Electrolytes stable.   7. Hyperkalemia - Improved on CVVHD.   Pt remains critically ill with guarded prognosis.   Length of Stay: 2  Annamaria Helling  04/19/2017, 8:27 AM  Advanced Heart Failure Team Pager [phone] (M-F; 7a - 4p)  Please contact [facility] for night-coverage after hours (4p -7a ) and weekends on [email or web address]  Agree with above.   He remains critically ill with significant vent support. Responding well to CVVHD.   Echo reviewed personally EF 55-60% with severely reduced RV function likely due to OSA/OHS and related PAH.   On exam Intubated sedated Cor IRR +RV lift Lungs coarse Ab obese. + distended Ext 1-2+ edema.   He remains very tenuous. Improved sightly on CVVHD and will continue. I had long talk with his daughter and Dr. Ashok Cordia in [place]. He has end-stage cor pulmonale and tenuous kidney function at baseline. Thus even if we can get him somewhat better he would be at high-risk for recurrent decompensation and death. We discussed code status.   For now, will continue with aggressive measures but will readdress on a daily basis. He is not a candidate for long-term HD.   CRITICAL CARE Performed by: Glori Bickers  Total critical care time: 45 minutes  Critical care time was exclusive of separately billable procedures and treating other patients.  Critical care was necessary to treat or prevent imminent or life-threatening deterioration.  Critical care was time spent personally by me (independent of midlevel providers or residents) on the following activities: development of treatment plan with patient and/or surrogate as well as nursing, discussions with consultants, evaluation of patient's response to treatment, examination of patient, obtaining history from patient or surrogate, ordering and performing treatments and  interventions, ordering and review of laboratory studies, ordering and review of radiographic studies, pulse oximetry and re-evaluation of patient's condition.  Glori Bickers, MD  2:44 PM

## 2017-04-20 LAB — POCT I-STAT 3, ART BLOOD GAS (G3+)
Acid-Base Excess: 2 mmol/L (ref 0.0–2.0)
BICARBONATE: 26 mmol/L (ref 20.0–28.0)
O2 Saturation: 94 %
PCO2 ART: 36 mmHg (ref 32.0–48.0)
PH ART: 7.465 — AB (ref 7.350–7.450)
Patient temperature: 97.7
TCO2: 27 mmol/L (ref 0–100)
pO2, Arterial: 64 mmHg — ABNORMAL LOW (ref 83.0–108.0)

## 2017-04-20 LAB — RENAL FUNCTION PANEL
ALBUMIN: 2.9 g/dL — AB (ref 3.5–5.0)
ANION GAP: 8 (ref 5–15)
Albumin: 2.8 g/dL — ABNORMAL LOW (ref 3.5–5.0)
Anion gap: 8 (ref 5–15)
BUN: 53 mg/dL — AB (ref 6–20)
BUN: 45 mg/dL — ABNORMAL HIGH (ref 6–20)
CHLORIDE: 103 mmol/L (ref 101–111)
CO2: 25 mmol/L (ref 22–32)
CO2: 25 mmol/L (ref 22–32)
CREATININE: 2.55 mg/dL — AB (ref 0.61–1.24)
Calcium: 7.5 mg/dL — ABNORMAL LOW (ref 8.9–10.3)
Calcium: 7.8 mg/dL — ABNORMAL LOW (ref 8.9–10.3)
Chloride: 104 mmol/L (ref 101–111)
Creatinine, Ser: 2.13 mg/dL — ABNORMAL HIGH (ref 0.61–1.24)
GFR calc Af Amer: 27 mL/min — ABNORMAL LOW (ref >60)
GFR, EST AFRICAN AMERICAN: 33 mL/min — AB (ref >60)
GFR, EST NON AFRICAN AMERICAN: 23 mL/min — AB (ref >60)
GFR, EST NON AFRICAN AMERICAN: 29 mL/min — AB (ref >60)
Glucose, Bld: 133 mg/dL — ABNORMAL HIGH (ref 65–99)
Glucose, Bld: 164 mg/dL — ABNORMAL HIGH (ref 65–99)
PHOSPHORUS: 4.1 mg/dL (ref 2.5–4.6)
Phosphorus: 3.9 mg/dL (ref 2.5–4.6)
Potassium: 4.4 mmol/L (ref 3.5–5.1)
Potassium: 4.4 mmol/L (ref 3.5–5.1)
SODIUM: 137 mmol/L (ref 135–145)
Sodium: 136 mmol/L (ref 135–145)

## 2017-04-20 LAB — GLUCOSE, CAPILLARY
GLUCOSE-CAPILLARY: 165 mg/dL — AB (ref 65–99)
GLUCOSE-CAPILLARY: 130 mg/dL — AB (ref 65–99)
GLUCOSE-CAPILLARY: 141 mg/dL — AB (ref 65–99)
GLUCOSE-CAPILLARY: 171 mg/dL — AB (ref 65–99)
Glucose-Capillary: 145 mg/dL — ABNORMAL HIGH (ref 65–99)
Glucose-Capillary: 154 mg/dL — ABNORMAL HIGH (ref 65–99)
Glucose-Capillary: 167 mg/dL — ABNORMAL HIGH (ref 65–99)
Glucose-Capillary: 174 mg/dL — ABNORMAL HIGH (ref 65–99)

## 2017-04-20 LAB — CBC WITH DIFFERENTIAL/PLATELET
Basophils Absolute: 0 K/uL (ref 0.0–0.1)
Basophils Relative: 0 %
Eosinophils Absolute: 0.1 K/uL (ref 0.0–0.7)
Eosinophils Relative: 1 %
HCT: 32 % — ABNORMAL LOW (ref 39.0–52.0)
Hemoglobin: 10.4 g/dL — ABNORMAL LOW (ref 13.0–17.0)
Lymphocytes Relative: 10 %
Lymphs Abs: 0.9 K/uL (ref 0.7–4.0)
MCH: 26.6 pg (ref 26.0–34.0)
MCHC: 32.5 g/dL (ref 30.0–36.0)
MCV: 81.8 fL (ref 78.0–100.0)
Monocytes Absolute: 1.3 K/uL — ABNORMAL HIGH (ref 0.1–1.0)
Monocytes Relative: 14 %
Neutro Abs: 7.1 K/uL (ref 1.7–7.7)
Neutrophils Relative %: 75 %
Platelets: 123 K/uL — ABNORMAL LOW (ref 150–400)
RBC: 3.91 MIL/uL — ABNORMAL LOW (ref 4.22–5.81)
RDW: 16.6 % — ABNORMAL HIGH (ref 11.5–15.5)
WBC: 9.4 K/uL (ref 4.0–10.5)

## 2017-04-20 LAB — MAGNESIUM: Magnesium: 2.3 mg/dL (ref 1.7–2.4)

## 2017-04-20 LAB — PROTIME-INR
INR: 2.98
PROTHROMBIN TIME: 31.6 s — AB (ref 11.4–15.2)

## 2017-04-20 MED ORDER — CHLORHEXIDINE GLUCONATE CLOTH 2 % EX PADS
6.0000 | MEDICATED_PAD | Freq: Every day | CUTANEOUS | Status: DC
  Administered 2017-04-21 – 2017-04-22 (×2): 6 via TOPICAL

## 2017-04-20 NOTE — Progress Notes (Signed)
Patient ID: William Strong, male   DOB: 1941-08-06, 76 y.o.   MRN: [id number] S: No events overnight, tolerating CVVHDF well O:BP (!) 116/55   Pulse (!) 109   Temp 97.6 F (36.4 C) (Axillary)   Resp (!) 25   Ht _0  (1.778 m)   Wt (!) 146.6 kg (323 lb 4.8 oz)   SpO2 93%   BMI 46.39 kg/m   Intake/Output Summary (Last 24 hours) at 04/20/17 1025 Last data filed at 04/20/17 1000  Gross per 24 hour  Intake          2049.42 ml  Output             6140 ml  Net         -4090.58 ml   Intake/Output: I/O last 3 completed shifts: In: 2859.7 [I.V.:1714.7; NG/GT:1145] Out: 3235 [Urine:1180; Other:7271]  Intake/Output this shift:  Total I/O In: 337.4 [I.V.:147.4; NG/GT:190] Out: 821 [Urine:45; Other:776] Weight change: -5.952 kg (-13 lb 2 oz) Gen: intubated/sedated CVS: tachy Resp:scattered rhonchi TDD:UKGUR, +BS Ext:1+ edema   Recent Labs Lab 04/21/2017 2322 04/18/17 0318 04/18/17 0451 04/18/17 0506 04/18/17 0638 04/18/17 1621 04/19/17 0319 04/19/17 1621 04/20/17 0500  NA 140 140 139 141 139 140 139 138 137  K 6.0* 6.1* 5.8* 5.6* 5.6* 4.5 4.3 4.6 4.4  CL 108 108 107  --  107 106 105 104 104  CO2 22 23 21*  --  21* 20* _1 GLUCOSE 149* 188* 172* 160* 151* 94 136* 163* 133*  BUN 117* 116* 116*  --  115* 114* 82* 63* 53*  CREATININE 6.23* 6.22* 6.13*  --  6.20* 5.93* 4.06* 3.07* 2.55*  ALBUMIN 3.4*  --   --   --   --  3.1* 3.0* 3.0* 2.9*  CALCIUM 7.1* 7.1* 7.0*  --  7.2* 7.3* 7.4* 7.4* 7.5*  PHOS  --   --   --   --   --  6.0* 5.0* 4.8* 4.1  AST 16  --   --   --   --   --   --   --   --   ALT 11*  --   --   --   --   --   --   --   --    Liver Function Tests:  Recent Labs Lab 04/26/2017 2322  04/19/17 0319 04/19/17 1621 04/20/17 0500  AST 16  --   --   --   --   ALT 11*  --   --   --   --   ALKPHOS 84  --   --   --   --   BILITOT 0.9  --   --   --   --   PROT 6.6  --   --   --   --   ALBUMIN 3.4*  < > 3.0* 3.0* 2.9*  < > = values in this interval not  displayed. No results for input(s): LIPASE, AMYLASE in the last 168 hours. No results for input(s): AMMONIA in the last 168 hours. CBC:  Recent Labs Lab 04/18/2017 2322 04/18/17 0506 04/19/17 0319 04/20/17 0406  WBC 8.4  --  8.5 9.4  NEUTROABS 6.8  --  5.8 7.1  HGB 10.3* 10.2* 10.0* 10.4*  HCT 32.2* 30.0* 31.0* 32.0*  MCV 85.0  --  81.4 81.8  PLT 131*  --  136* 123*   Cardiac Enzymes:  Recent Labs Lab 04/18/17 0451 04/18/17 [place]  0.05* 0.06*   CBG:  Recent Labs Lab 04/19/17 1611 04/19/17 2056 04/20/17 0017 04/20/17 0422 04/20/17 0803  GLUCAP 155* 165* 145* 130* 141*    Iron Studies: No results for input(s): IRON, TIBC, TRANSFERRIN, FERRITIN in the last 72 hours. Studies/Results: US Renal  Result Date: 04/18/2017 CLINICAL DATA:  Renal failure EXAM: RENAL ULTRASOUND COMPARISON:  None. FINDINGS: Right Kidney: Length: 11.3 cm. Echogenicity and renal cortical thickness are within normal limits. No perinephric fluid or hydronephrosis visualized. There is a cyst arising from the upper pole of the right kidney measuring 3.1 x 2.7 x 2.3 cm. No sonographically demonstrable calculus or ureterectasis. Left Kidney: Unable to visualize due to overlying gas. No obvious lesion seen by ultrasound in the left perinephric region. Bladder: Decompressed with Foley catheter and could not be visualized IMPRESSION: Unable to visualize left kidney due to overlying gas and limitation in patient motion or ability to cooperate. Cyst arising from upper pole right kidney. Right kidney otherwise appears unremarkable. Urinary bladder decompressed and unable to assess. Electronically Signed   By: Lowella Grip III M.D.   On: 04/18/2017 11:59   Dg Chest Port 1 View  Result Date: 04/19/2017 CLINICAL DATA:  Intubation.  Respiratory failure EXAM: PORTABLE CHEST 1 VIEW COMPARISON:  Yesterday FINDINGS: Cardiomegaly that is stable. Status post CABG. Left IJ central line with tip at the SVC  brachiocephalic confluence. Endotracheal tube tip at the clavicular heads. Diffuse interstitial opacity with haziness at the left more than right base from atelectasis or pleural fluid. No visible pneumothorax. IMPRESSION: 1. Improved pulmonary edema. 2. Stable positioning of tubes and central line. Electronically Signed   By: Monte Fantasia M.D.   On: 04/19/2017 07:08   Dg Chest Port 1 View  Addendum Date: 04/18/2017   ADDENDUM REPORT: 04/18/2017 14:59 ADDENDUM: Impression should read: No pneumothorax following central venous catheter placement Electronically Signed   By: Inez Catalina M.D.   On: 04/18/2017 14:59   Result Date: 04/18/2017 CLINICAL DATA:  Check endotracheal tube placement and central venous line placement EXAM: PORTABLE CHEST 1 VIEW COMPARISON:  04/18/2017 FINDINGS: New left central venous line is noted with catheter tip in the proximal superior vena cava. Swan-Ganz catheter is noted in the right pulmonary outflow tract. An endotracheal tube and nasogastric catheter are in satisfactory position. No pneumothorax is noted. Vascular congestion is again noted and increasing. IMPRESSION: Pneumothorax following central venous catheter placement. Increase in the degree of congestive failure. Electronically Signed: By: Inez Catalina M.D. On: 04/18/2017 14:15   Dg Chest Port 1 View  Result Date: 04/18/2017 CLINICAL DATA:  Acute respiratory failure, hypoxia EXAM: PORTABLE CHEST 1 VIEW COMPARISON:  04/18/2017 FINDINGS: Endotracheal tube and NG tube are stable. Repositioning of the Swan-Ganz catheter with the tip in the right lower lobe pulmonary artery. Cardiomegaly with vascular congestion and mild pulmonary edema, stable. Possible small layering effusions. IMPRESSION: No significant change in the CHF pattern. Suspect small layering effusions. Electronically Signed   By: Rolm Baptise M.D.   On: 04/18/2017 11:02   . chlorhexidine gluconate (MEDLINE KIT)  15 mL Mouth Rinse BID  . Chlorhexidine  Gluconate Cloth  6 each Topical Daily  . feeding supplement (PRO-STAT SUGAR FREE 64)  60 mL Per Tube QID  . feeding supplement (VITAL HIGH PROTEIN)  1,000 mL Per Tube Q24H  . insulin aspart  2-6 Units Subcutaneous Q4H  . ipratropium-albuterol  3 mL Nebulization Q6H  . mouth rinse  15 mL Mouth Rinse 10 times per day  .  pantoprazole sodium  40 mg Per Tube Daily  . sennosides  5 mL Per Tube BID  . sildenafil  20 mg Oral Q8H  . sodium chloride flush  10-40 mL Intracatheter Q12H  . sodium chloride flush  3 mL Intravenous Q12H    BMET    Component Value Date/Time   NA 137 04/20/2017 0500   K 4.4 04/20/2017 0500   CL 104 04/20/2017 0500   CO2 25 04/20/2017 0500   GLUCOSE 133 (H) 04/20/2017 0500   BUN 53 (H) 04/20/2017 0500   CREATININE 2.55 (H) 04/20/2017 0500   CALCIUM 7.5 (L) 04/20/2017 0500   GFRNONAA 23 (L) 04/20/2017 0500   GFRAA 27 (L) 04/20/2017 0500   CBC    Component Value Date/Time   WBC 9.4 04/20/2017 0406   RBC 3.91 (L) 04/20/2017 0406   HGB 10.4 (L) 04/20/2017 0406   HCT 32.0 (L) 04/20/2017 0406   PLT 123 (L) 04/20/2017 0406   MCV 81.8 04/20/2017 0406   MCH 26.6 04/20/2017 0406   MCHC 32.5 04/20/2017 0406   RDW 16.6 (H) 04/20/2017 0406   LYMPHSABS 0.9 04/20/2017 0406   MONOABS 1.3 (H) 04/20/2017 0406   EOSABS 0.1 04/20/2017 0406   BASOSABS 0.0 04/20/2017 0406    Background: This is a critically-ill 76 y/o M here from outside hospital with right heart failure, acute (oliguric) on chronic renal failure, acute on chronic respiratory failure and massive volume overload. He did not respond well to aggressive IV diuresis. CRRT initiated 04/18/17.  Assessment/Recommendations  1. Acute on Chronic Kidney Injury, Cardiorenal syndrome: CKD3-4 at [facility] w/ hx of DM2 and HTN. Here w/ oliguric renal failure most likely secondary to cardiorenal syndrome/shock. Now with improved UOP following UF with CVVHD and without IV diuresis. Cr 6.23 on admission and hyperkalemic to 6.1  however both improved on CRRT. He remains significantly volume overloaded.   1. Continue with CVVHDF for now and re-evaluate on Monday as discussed with Dr. Haroldine Laws.  2. I&O's, daily weights 3. Pt is a poor longterm dialysis candidate and Dr. Haroldine Laws has discussed this with the family who are aware and reasonable about his prognosis. 2. Right Heart Failure, preserved LVEF: Severe RV dysfunction noted on recent echo. Did not respond to aggressive diureses at outside hospital.  1. Per HF 3. Acute on Chronic Respiratory Failure, pHTN: On 4L O2 via [place] at home at baseline. Currently intubated. Worsening resp status no doubt secondary to volume status in setting of his chronic respiratory failure 1. CCM on, appreciate 2. Vent 3. Sildenafil 60m TID 4. Hypotension/Shock: Cardiogenic.  1. OFF Levophed. 2. Monitor hemodynamics closely 5. Hyperkalemia, Hyperphosphatemia: resolved after CRRT 6. CAD with hx of CABG: Doesn't appear to be active CAD/ischemia currently. Monitor per cards 7. Atrial Fibrillation: ?On warfarin at home?  1. Will follow on telemetry 2. Heparin here.  JDonetta Potts MD [phone]

## 2017-04-20 NOTE — Progress Notes (Signed)
ANTICOAGULATION CONSULT NOTE   Pharmacy Consult for heparin Indication: atrial fibrillation  Allergies  Allergen Reactions  . Oxycontin [Oxycodone Hcl] Shortness Of Breath, Nausea And Vomiting and Other (See Comments)    Reported by patient    Patient Measurements: Height: 5\' 10"  (177.8 cm) Weight: (!) 323 lb 4.8 oz (146.6 kg) IBW/kg (Calculated) : 73 Heparin Dosing Weight: 115 kg  Vital Signs: Temp: 97.6 F (36.4 C) (08/18 0800) Temp Source: Axillary (08/18 0800) BP: 116/55 (08/18 0028) Pulse Rate: 104 (08/18 0900)  Labs:  Recent Labs  04/21/2017 2322  04/18/17 0451 04/18/17 0506 04/18/17 5885  04/19/17 0319 04/19/17 1030 04/19/17 1621 04/20/17 0406 04/20/17 0500  HGB 10.3*  --   --  10.2*  --   --  10.0*  --   --  10.4*  --   HCT 32.2*  --   --  30.0*  --   --  31.0*  --   --  32.0*  --   PLT 131*  --   --   --   --   --  136*  --   --  123*  --   APTT 45*  --   --   --   --   --   --   --   --   --   --   LABPROT 31.6*  --   --   --   --   --   --  35.4*  --  31.6*  --   INR 2.98  --   --   --   --   --   --  3.43  --  2.98  --   CREATININE 6.23*  < > 6.13*  --  6.20*  < > 4.06*  --  3.07*  --  2.55*  TROPONINI  --   --  0.05*  --  0.06*  --   --   --   --   --   --   < > = values in this interval not displayed.  Estimated Creatinine Clearance: 36.8 mL/min (A) (by C-G formula based on SCr of 2.55 mg/dL (H)).   Medical History: Past Medical History:  Diagnosis Date  . A-fib (HCC)   . CAD (coronary artery disease)   . Diabetes (HCC)   . Dyslipidemia   . HTN (hypertension)   . Hx of CABG   . Pulmonary HTN (HCC)   . Right-sided heart failure (HCC)     Medications:  Infusions:  . sodium chloride Stopped (04/18/17 1800)  . sodium chloride    . sodium chloride    . norepinephrine (LEVOPHED) Adult infusion 3 mcg/min (04/20/17 0900)  . dialysis replacement fluid (prismasate) 200 mL/hr at 04/20/17 0928  . dialysis replacement fluid (prismasate) 300 mL/hr  at 04/20/17 0204  . dialysate (PRISMASATE) 2,000 mL/hr at 04/20/17 0644  . propofol (DIPRIVAN) infusion 50 mcg/kg/min (04/20/17 0900)  . sodium chloride      Assessment: 76 yo male on chronic Coumadin for Strong, William on hold.  Pharmacy asked to begin IV heparin once INR < 2.  Today's INR still elevated at 2.98.  Has not had any Coumadin since admission.  Goal of Therapy:  Heparin level 0.3-0.7 units/ml Monitor platelets by anticoagulation protocol: Yes   Plan:  1. Daily INR. 2. Will start IV heparin when INR < 2.  Tad Moore, BCPS  Clinical Pharmacist Pager [phone]  04/20/2017 9:30 AM

## 2017-04-20 NOTE — Progress Notes (Signed)
Advanced Heart Failure Rounding Note  Primary Cardiologist: [facility] HF: NEW (Dr. Haroldine Laws)   Subjective:    Transferred from [place], [place] on 8/15 with R sided HF, pulm HTN, ARF and acute respiratory failure.   Intubated 04/04/2017 with pH 7.1.  Trialysis cath placed 04/18/17 and started on CRRT. Weight down 13 pounds overnight. 17 pounds total. SBPs stable on NE 3.. Urine output ~ 400cc/24hour  Remains intubated and sedated. FiO2 up to 70% last night but now back down to 60% PEEP 15  CVP 26   Studies:  Echo 04/18/17 LVEF 55-60%, Mod LAE, Severely dilated RV. RV function severely reduced. RA severely dilated.   Echo 04/16/17 (in [place]) - Mildly dilated LV w/o LVH, EF est 55-60%, mildly dilated RV w/ severe RV systolic dysfunction, mildly dilated LA, severely dilated RA, mild MAC w/ mild MR, mild TR, mild pulm HTN w/ est PAS ~46mHg  RHC 09-24-16  RA 25/32 mean 216mg RV 8028m w/ EDP 59m70mPA 80/35 with mean 52mm20mCW 11/15 with mean of 10mmH3m 6.5 L/min, CI 2.5, PVR 6.1 Wood units  Objective:   Weight Range: (!) 146.6 kg (323 lb 4.8 oz) Body mass index is 46.39 kg/m.   Vital Signs:   Temp:  [96.5 F (35.8 C)-98.3 F (36.8 C)] 97.6 F (36.4 C) (08/18 0800) Pulse Rate:  [60-122] 104 (08/18 0845) Resp:  [20-37] 28 (08/18 0845) BP: (108-116)/(54-55) 116/55 (08/18 0028) SpO2:  [90 %-97 %] 93 % (08/18 0845) Arterial Line BP: (60-152)/(39-71) 125/58 (08/18 0845) FiO2 (%):  [60 %-80 %] 60 % (08/18 0828) Weight:  [146.6 kg (323 lb 4.8 oz)] 146.6 kg (323 lb 4.8 oz) (08/18 0258) Last BM Date: 04/18/17  Weight change: Filed Weights   04/18/17 0430 04/19/17 0500 04/20/17 0258  Weight: (!) 153.8 kg (339 lb) (!) 152.6 kg (336 lb 6.8 oz) (!) 146.6 kg (323 lb 4.8 oz)    Intake/Output:   Intake/Output Summary (Last 24 hours) at 04/20/17 0917 Last data filed at 04/20/17 0800  Gross per 24 hour  Intake          1811.01 ml  Output             5818 ml    Net         -4006.99 ml      Physical Exam    General:  Intubated/sedated HEENT: normal Neck: supple.L IJ trialysis  Carotids 2+ bilat; no bruits. No lymphadenopathy or thryomegaly appreciated. Cor: PMI nondisplaced. Regular rate & rhythm. RV lift. No rubs, gallops or murmurs. Lungs: coarse Abdomen: soft, nontender, obese + distended. No hepatosplenomegaly. No bruits or masses. Good bowel sounds. Extremities: no cyanosis, clubbing, rash, 1+ edema Neuro: alert & orientedx3, cranial nerves grossly intact. moves all 4 extremities w/o difficulty. Affect pleasant   Telemetry   Probable NSR vs atrial tach 90s. (Personally reviewed)  EKG    Personally reviewed, Probably NSR with RBBB. Low volts.   Labs    CBC  Recent Labs  04/19/17 0319 04/20/17 0406  WBC 8.5 9.4  NEUTROABS 5.8 7.1  HGB 10.0* 10.4*  HCT 31.0* 32.0*  MCV 81.4 81.8  PLT 136* 123*  062sic Metabolic Panel  Recent Labs  04/19/17 0319 04/19/17 1621 04/20/17 0406 04/20/17 0500  NA 139 138  --  137  K 4.3 4.6  --  4.4  CL 105 104  --  104  CO2 22 24  --  25  GLUCOSE 136* 163*  --  133*  BUN 82* 63*  --  53*  CREATININE 4.06* 3.07*  --  2.55*  CALCIUM 7.4* 7.4*  --  7.5*  MG 2.3  --  2.3  --   PHOS 5.0* 4.8*  --  4.1   Liver Function Tests  Recent Labs  04/26/2017 2322  04/19/17 1621 04/20/17 0500  AST 16  --   --   --   ALT 11*  --   --   --   ALKPHOS 84  --   --   --   BILITOT 0.9  --   --   --   PROT 6.6  --   --   --   ALBUMIN 3.4*  < > 3.0* 2.9*  < > = values in this interval not displayed. No results for input(s): LIPASE, AMYLASE in the last 72 hours. Cardiac Enzymes  Recent Labs  04/18/17 0451 04/18/17 0638  TROPONINI 0.05* 0.06*    BNP: BNP (last 3 results)  Recent Labs  04/04/2017 2322  BNP 296.5*    ProBNP (last 3 results) No results for input(s): PROBNP in the last 8760 hours.   D-Dimer No results for input(s): DDIMER in the last 72 hours. Hemoglobin  A1C  Recent Labs  04/18/17 0318  HGBA1C 6.4*   Fasting Lipid Panel  Recent Labs  04/18/17 0317  TRIG 118   Thyroid Function Tests  Recent Labs  04/19/17 1030  TSH 1.995    Other results:   Imaging    No results found.   Medications:     Scheduled Medications: . chlorhexidine gluconate (MEDLINE KIT)  15 mL Mouth Rinse BID  . Chlorhexidine Gluconate Cloth  6 each Topical Daily  . feeding supplement (PRO-STAT SUGAR FREE 64)  60 mL Per Tube QID  . feeding supplement (VITAL HIGH PROTEIN)  1,000 mL Per Tube Q24H  . insulin aspart  2-6 Units Subcutaneous Q4H  . ipratropium-albuterol  3 mL Nebulization Q6H  . mouth rinse  15 mL Mouth Rinse 10 times per day  . pantoprazole sodium  40 mg Per Tube Daily  . sennosides  5 mL Per Tube BID  . sildenafil  20 mg Oral Q8H  . sodium chloride flush  10-40 mL Intracatheter Q12H  . sodium chloride flush  3 mL Intravenous Q12H    Infusions: . sodium chloride Stopped (04/18/17 1800)  . sodium chloride    . sodium chloride    . norepinephrine (LEVOPHED) Adult infusion 4 mcg/min (04/20/17 0825)  . dialysis replacement fluid (prismasate) 200 mL/hr at 04/19/17 1508  . dialysis replacement fluid (prismasate) 300 mL/hr at 04/20/17 0204  . dialysate (PRISMASATE) 2,000 mL/hr at 04/20/17 0644  . propofol (DIPRIVAN) infusion 50 mcg/kg/min (04/20/17 0800)  . sodium chloride      PRN Medications: sodium chloride, sodium chloride, Place/Maintain arterial line **AND** sodium chloride, bisacodyl, fentaNYL (SUBLIMAZE) injection, heparin, midazolam, ondansetron (ZOFRAN) IV, sodium chloride, sodium chloride flush, sodium chloride flush  Previous Imaging  Nuc stress 09-05-16 showed no significant ischemia; est LVEF 47%  RHC 09-24-16 RA 25/32 mean 25mHg RV 867mg w/ EDP 2536m PA 80/35 with mean 20m32mPCW 11/15 with mean of 10mm18mO 6.5 L/min, CI 2.5, PVR 6.1 Wood units  LHC 01-30-12 LM nl, 50% mLAD, CTO ostial LCx, 80% mRCA Grafts:  LIMA-LAD atrentic distally, SVG-OM1 patent, filling OM2 retrograde with ~95% in OM2 (small vessel not amenable for intervention), SVG-PDA patent with LI  Patient Profile   William Strong 76 y.9  male with h/o CAD s/p CABG in 2008 at [facility] (LIMA-LAD known to be atretic, SVG-OM1, SVG-PDA), R sided HF w/ preserved LVEF, pulm HTN (severe on RHC Jan 2018), chronic renal failure, parox afib, HTN, dyslipidemia, DM2.  Pt transferred from [place], [place] with worsening HF, ARF, and acute respiratory failure in setting of chronic RV failure and cor pulmonale  Assessment/Plan   1. 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 Acute respiratory failure - likely due to OHS/OSA and cor pulmonale - Intubated and sedated. Appreciate CCM support.   - remains on 60% FIO2 and PEEP 15. Wean as toelrated - family says he would refuse trach  3. 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 Be careful not to overpull as he likely will not tolerate CVP below 15 with RV failure. D/w Dr. Tora Kindred  4. CAD: h/o CABG.  - No acute issues or active CAD a this time.  - LVEF is preserved.   5. OHS/pulm HTN:  - Appreciate CCM support.    6. Afib:  - Per notes patient has chronic Afib and has been on chronic coumadin.  - CHA2DS2/VASc is at least 4.  - Rhythm hard to assess on tele. Intermittent p-waves and fairly regular. Will repeat ECG  7. Hyperkalemia - Improved on CVVHD.   He remains very tenuous. Improved sightly on CVVHD and will continue. I had long talk with his daughter and Dr. Ashok Cordia in [place]. He has end-stage cor pulmonale and tenuous kidney function at baseline. Thus even if we can get him somewhat better he would be at high-risk for recurrent decompensation and death. We discussed code status again. Continue vent over the weekend. Ok with shock. No CPR.   He would not want trach  and will not be candidate for outpatient HD.   Will continue CVVHD and intubation throughout the weekend. Reassess for possible extubation on Monday. If no progress may need to move to comfort care.   CRITICAL CARE Performed by: Glori Bickers  Total critical care time: 45 minutes  Critical care time was exclusive of separately billable procedures and treating other patients.  Critical care was necessary to treat or prevent imminent or life-threatening deterioration.  Critical care was time spent personally by me (independent of midlevel providers or residents) on the following activities: development of treatment plan with patient and/or surrogate as well as nursing, discussions with consultants, evaluation of patient's response to treatment, examination of patient, obtaining history from patient or surrogate, ordering and performing treatments and interventions, ordering and review of laboratory studies, ordering and review of radiographic studies, pulse oximetry and re-evaluation of patient's condition.    Length of Stay: 3  Glori Bickers, MD  04/20/2017, 9:17 AM  Advanced Heart Failure Team Pager [phone] (M-F; 7a - 4p)  Please contact [facility] for night-coverage after hours (4p -7a ) and weekends on [email or web address]

## 2017-04-21 LAB — GLUCOSE, CAPILLARY
GLUCOSE-CAPILLARY: 119 mg/dL — AB (ref 65–99)
GLUCOSE-CAPILLARY: 141 mg/dL — AB (ref 65–99)
Glucose-Capillary: 155 mg/dL — ABNORMAL HIGH (ref 65–99)
Glucose-Capillary: 142 mg/dL — ABNORMAL HIGH (ref 65–99)

## 2017-04-21 LAB — CBC WITH DIFFERENTIAL/PLATELET
Basophils Absolute: 0 10*3/uL (ref 0.0–0.1)
Basophils Relative: 0 %
EOS ABS: 0.1 10*3/uL (ref 0.0–0.7)
Eosinophils Relative: 1 %
HEMATOCRIT: 34.5 % — AB (ref 39.0–52.0)
HEMOGLOBIN: 11.1 g/dL — AB (ref 13.0–17.0)
LYMPHS ABS: 0.9 10*3/uL (ref 0.7–4.0)
Lymphocytes Relative: 8 %
MCH: 26.7 pg (ref 26.0–34.0)
MCHC: 32.2 g/dL (ref 30.0–36.0)
MCV: 83.1 fL (ref 78.0–100.0)
Monocytes Absolute: 1.8 10*3/uL — ABNORMAL HIGH (ref 0.1–1.0)
Monocytes Relative: 17 %
NEUTROS PCT: 74 %
Neutro Abs: 8.1 10*3/uL — ABNORMAL HIGH (ref 1.7–7.7)
Platelets: 124 10*3/uL — ABNORMAL LOW (ref 150–400)
RBC: 4.15 MIL/uL — AB (ref 4.22–5.81)
RDW: 16.8 % — ABNORMAL HIGH (ref 11.5–15.5)
WBC: 10.9 10*3/uL — AB (ref 4.0–10.5)

## 2017-04-21 LAB — RENAL FUNCTION PANEL
ALBUMIN: 2.7 g/dL — AB (ref 3.5–5.0)
ANION GAP: 10 (ref 5–15)
Albumin: 2.8 g/dL — ABNORMAL LOW (ref 3.5–5.0)
Anion gap: 5 (ref 5–15)
BUN: 44 mg/dL — ABNORMAL HIGH (ref 6–20)
BUN: 42 mg/dL — AB (ref 6–20)
CHLORIDE: 101 mmol/L (ref 101–111)
CHLORIDE: 102 mmol/L (ref 101–111)
CO2: 24 mmol/L (ref 22–32)
CO2: 25 mmol/L (ref 22–32)
CREATININE: 2.08 mg/dL — AB (ref 0.61–1.24)
CREATININE: 1.89 mg/dL — AB (ref 0.61–1.24)
Calcium: 7.9 mg/dL — ABNORMAL LOW (ref 8.9–10.3)
Calcium: 7.7 mg/dL — ABNORMAL LOW (ref 8.9–10.3)
GFR calc Af Amer: 39 mL/min — ABNORMAL LOW (ref >60)
GFR calc non Af Amer: 33 mL/min — ABNORMAL LOW (ref >60)
GFR, EST AFRICAN AMERICAN: 34 mL/min — AB (ref >60)
GFR, EST NON AFRICAN AMERICAN: 30 mL/min — AB (ref >60)
GLUCOSE: 169 mg/dL — AB (ref 65–99)
Glucose, Bld: 150 mg/dL — ABNORMAL HIGH (ref 65–99)
POTASSIUM: 4.6 mmol/L (ref 3.5–5.1)
POTASSIUM: 4.4 mmol/L (ref 3.5–5.1)
Phosphorus: 4.4 mg/dL (ref 2.5–4.6)
Phosphorus: 3.5 mg/dL (ref 2.5–4.6)
Sodium: 135 mmol/L (ref 135–145)
Sodium: 132 mmol/L — ABNORMAL LOW (ref 135–145)

## 2017-04-21 LAB — PROTIME-INR
INR: 2.61
INR: 2.38
INR: 2.11
Prothrombin Time: 28.4 seconds — ABNORMAL HIGH (ref 11.4–15.2)
Prothrombin Time: 26.4 seconds — ABNORMAL HIGH (ref 11.4–15.2)
Prothrombin Time: 24 seconds — ABNORMAL HIGH (ref 11.4–15.2)

## 2017-04-21 LAB — TRIGLYCERIDES: Triglycerides: 110 mg/dL (ref <150)

## 2017-04-21 LAB — MAGNESIUM: MAGNESIUM: 2.4 mg/dL (ref 1.7–2.4)

## 2017-04-21 MED ORDER — AMIODARONE HCL IN DEXTROSE 360-4.14 MG/200ML-% IV SOLN
30.0000 mg/h | INTRAVENOUS | Status: DC
  Administered 2017-04-21: 30 mg/h via INTRAVENOUS
  Administered 2017-04-22: 60 mg/h via INTRAVENOUS
  Filled 2017-04-21 (×2): qty 200

## 2017-04-21 MED ORDER — AMIODARONE LOAD VIA INFUSION
150.0000 mg | Freq: Once | INTRAVENOUS | Status: AC
  Administered 2017-04-21: 150 mg via INTRAVENOUS
  Filled 2017-04-21: qty 83.34

## 2017-04-21 MED ORDER — HEPARIN (PORCINE) IN NACL 100-0.45 UNIT/ML-% IJ SOLN
1600.0000 [IU]/h | INTRAMUSCULAR | Status: DC
Start: 2017-04-21 — End: 2017-04-23
  Administered 2017-04-21: 1500 [IU]/h via INTRAVENOUS
  Filled 2017-04-21 (×2): qty 250

## 2017-04-21 MED ORDER — AMIODARONE HCL IN DEXTROSE 360-4.14 MG/200ML-% IV SOLN
60.0000 mg/h | INTRAVENOUS | Status: AC
  Administered 2017-04-21 (×2): 60 mg/h via INTRAVENOUS
  Filled 2017-04-21: qty 200

## 2017-04-21 NOTE — Progress Notes (Signed)
Advanced Heart Failure Rounding Note  Primary Cardiologist: [facility] HF: NEW (Dr. Haroldine Laws)   Subjective:    Transferred from [place], [place] on 8/15 with R sided HF, pulm HTN, ARF and acute respiratory failure.   Intubated 04/18/2017 with pH 7.1.  Trialysis cath placed 04/18/17 and started on CRRT.   Remains on CVVHD still pulling well - decreased from 300/hr to 100/hr. CVP now down to 16. . I/O -3L but weight recorded as up 3 pounds. Urine output remains poor (~350cc/24hr). Norepi down to 2. RN says he wakened a bit last night but then got agitated.  Remains intubated/ sedated down to 50% FiO2. PEEP 15  Rhythm appears to be atrial tach   Studies:  Echo 04/18/17 LVEF 55-60%, Mod LAE, Severely dilated RV. RV function severely reduced. RA severely dilated.   Echo 04/16/17 (in [place]) - Mildly dilated LV w/o LVH, EF est 55-60%, mildly dilated RV w/ severe RV systolic dysfunction, mildly dilated LA, severely dilated RA, mild MAC w/ mild MR, mild TR, mild pulm HTN w/ est PAS ~73mHg  RHC 09-24-16  RA 25/32 mean 269mg RV 8062m w/ EDP 31m34mPA 80/35 with mean 52mm82mCW 11/15 with mean of 10mmH76m 6.5 L/min, CI 2.5, PVR 6.1 Wood units  Objective:   Weight Range: (!) 147.9 kg (326 lb) Body mass index is 46.78 kg/m.   Vital Signs:   Temp:  [96.7 F (35.9 C)-97.9 F (36.6 C)] 97.9 F (36.6 C) (08/19 0424) Pulse Rate:  [100-109] 105 (08/19 0700) Resp:  [21-29] 28 (08/19 0700) BP: (89-176)/(51-74) 113/67 (08/19 0600) SpO2:  [90 %-98 %] 93 % (08/19 0700) Arterial Line BP: (96-149)/(50-67) 96/50 (08/19 0700) FiO2 (%):  [50 %-60 %] 50 % (08/19 0340) Weight:  [147.9 kg (326 lb)] 147.9 kg (326 lb) (08/19 0207) Last BM Date: 04/21/17  Weight change: Filed Weights   04/19/17 0500 04/20/17 0258 04/21/17 0207  Weight: (!) 152.6 kg (336 lb 6.8 oz) (!) 146.6 kg (323 lb 4.8 oz) (!) 147.9 kg (326 lb)    Intake/Output:   Intake/Output Summary (Last 24 hours) at  04/21/17 0816 Last data filed at 04/21/17 0700  Gross per 24 hour  Intake          2124.94 ml  Output             4945 ml  Net         -2820.06 ml      Physical Exam    General:  Intubated sedated  HEENT: normal x ETT Neck: supple. LIJ trialysis. Carotids 2+ bilat; no bruits. No lymphadenopathy or thryomegaly appreciated. Cor: Tachy regular + RV lift  Lungs: clear  Abdomen: obese soft, nontender, nondistended. No hepatosplenomegaly. No bruits or masses. Good bowel sounds. Extremities: no cyanosis, clubbing, rash, edema Neuro: intubated sedated    Telemetry   Probable atrial tach 90-100s. (Personally reviewed)  EKG    Personally reviewed, Probably NSR with RBBB. Low volts.   Labs    CBC  Recent Labs  04/20/17 0406 04/21/17 0330  WBC 9.4 10.9*  NEUTROABS 7.1 8.1*  HGB 10.4* 11.1*  HCT 32.0* 34.5*  MCV 81.8 83.1  PLT 123* 124*  767sic Metabolic Panel  Recent Labs  04/20/17 0406  04/20/17 1545 04/21/17 0330  NA  --   < > 136 135  K  --   < > 4.4 4.6  CL  --   < > 103 101  CO2  --   < >  25 24  GLUCOSE  --   < > 164* 150*  BUN  --   < > 45* 44*  CREATININE  --   < > 2.13* 2.08*  CALCIUM  --   < > 7.8* 7.9*  MG 2.3  --   --  2.4  PHOS  --   < > 3.9 4.4  < > = values in this interval not displayed. Liver Function Tests  Recent Labs  04/20/17 1545 04/21/17 0330  ALBUMIN 2.8* 2.8*   No results for input(s): LIPASE, AMYLASE in the last 72 hours. Cardiac Enzymes No results for input(s): CKTOTAL, CKMB, CKMBINDEX, TROPONINI in the last 72 hours.  BNP: BNP (last 3 results)  Recent Labs  04/25/2017 2322  BNP 296.5*    ProBNP (last 3 results) No results for input(s): PROBNP in the last 8760 hours.   D-Dimer No results for input(s): DDIMER in the last 72 hours. Hemoglobin A1C No results for input(s): HGBA1C in the last 72 hours. Fasting Lipid Panel  Recent Labs  04/21/17 0330  TRIG 110   Thyroid Function Tests  Recent Labs   04/19/17 1030  TSH 1.995    Other results:   Imaging    No results found.   Medications:     Scheduled Medications: . chlorhexidine gluconate (MEDLINE KIT)  15 mL Mouth Rinse BID  . Chlorhexidine Gluconate Cloth  6 each Topical Daily  . feeding supplement (PRO-STAT SUGAR FREE 64)  60 mL Per Tube QID  . feeding supplement (VITAL HIGH PROTEIN)  1,000 mL Per Tube Q24H  . insulin aspart  2-6 Units Subcutaneous Q4H  . ipratropium-albuterol  3 mL Nebulization Q6H  . mouth rinse  15 mL Mouth Rinse 10 times per day  . pantoprazole sodium  40 mg Per Tube Daily  . sennosides  5 mL Per Tube BID  . sildenafil  20 mg Oral Q8H  . sodium chloride flush  10-40 mL Intracatheter Q12H  . sodium chloride flush  3 mL Intravenous Q12H    Infusions: . sodium chloride Stopped (04/18/17 1800)  . sodium chloride    . sodium chloride    . norepinephrine (LEVOPHED) Adult infusion 4 mcg/min (04/21/17 0736)  . dialysis replacement fluid (prismasate) 200 mL/hr at 04/20/17 1635  . dialysis replacement fluid (prismasate) 300 mL/hr at 04/20/17 1703  . dialysate (PRISMASATE) 2,000 mL/hr at 04/21/17 0717  . propofol (DIPRIVAN) infusion 50 mcg/kg/min (04/21/17 0542)  . sodium chloride      PRN Medications: sodium chloride, sodium chloride, Place/Maintain arterial line **AND** sodium chloride, bisacodyl, fentaNYL (SUBLIMAZE) injection, heparin, midazolam, ondansetron (ZOFRAN) IV, sodium chloride, sodium chloride flush, sodium chloride flush  Previous Imaging  Nuc stress 09-05-16 showed no significant ischemia; est LVEF 47%  RHC 09-24-16 RA 25/32 mean 37mHg RV 813mg w/ EDP 2521m PA 80/35 with mean 29m68mPCW 11/15 with mean of 10mm42mO 6.5 L/min, CI 2.5, PVR 6.1 Wood units  LHC 01-30-12 LM nl, 50% mLAD, CTO ostial LCx, 80% mRCA Grafts: LIMA-LAD atrentic distally, SVG-OM1 patent, filling OM2 retrograde with ~95% in OM2 (small vessel not amenable for intervention), SVG-PDA patent with  LI  Patient Profile   William Strong 76 y.51 male with h/o CAD s/p CABG in 2008 at [facility] (LIMA-LAD known to be atretic, SVG-OM1, SVG-PDA), R sided HF w/ preserved LVEF, pulm HTN (severe on RHC Jan 2018), chronic renal failure, parox afib, HTN, dyslipidemia, DM2.  [place], ARF, and  acute respiratory failure in setting of chronic RV failure and cor pulmonale  Assessment/Plan   1. 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 Wean as tolerated   2. Acute respiratory failure - likely due to OHS/OSA and cor pulmonale - Intubated and sedated. Appreciate CCM support.   - Vent down to 50% FIO2 and PEEP 15. Wean as tolerated. Hopefully we can try for extubation early this week.  - family says he would refuse trach  3. 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 Be careful not to overpull as he likely will not tolerate CVP below 15 with RV failure. - CVP 16 today. Will pull 100cc/hr throughout day and follow CVP hopefully we can get to 12-15 range to help facilitate extubation early this week.  - Not HD candidate. Will have to wait and see if he gets any renal recovery. If not would be comfort care  4. CAD: h/o CABG.  - No acute issues or active CAD a this time.  - LVEF is preserved.   5. OHS/pulm HTN:  - Appreciate CCM support.  - See above. Refuses trach   6. Afib:  - Per notes patient has chronic Afib and has been on chronic coumadin.  - CHA2DS2/VASc is at least 4.  - Rhythm seems to be atrial tach.  - Will start IV amio. - INR coming down slowly. Will recheck INR later today. If ~2.0-2.1 will start heparin and plan for possible DCCV while intubated to improve hemodynamics as much as possible. D/w PharmD  7. Hyperkalemia - Improved on CVVHD.   8. Code status  -He remains very  tenuous. Improved sightly on CVVHD and will continue. I had long talk with his daughter and Dr. Ashok Cordia in [place]. He has end-stage cor pulmonale and tenuous kidney function at baseline. Thus even if we can get him somewhat better he would be at high-risk for recurrent decompensation and death. We discussed code status again. Continue vent over the weekend. Ok with shock. No CPR.  -He would not want trach and will not be candidate for outpatient HD.  -Will continue CVVHD and intubation throughout the weekend. Reassess for possible extubation early this week. If no progress may need to move to comfort care.   CRITICAL CARE Performed by: Glori Bickers  Total critical care time: 35 minutes  Critical care time was exclusive of separately billable procedures and treating other patients.  Critical care was necessary to treat or prevent imminent or life-threatening deterioration.  Critical care was time spent personally by me (independent of midlevel providers or residents) on the following activities: development of treatment plan with patient and/or surrogate as well as nursing, discussions with consultants, evaluation of patient's response to treatment, examination of patient, obtaining history from patient or surrogate, ordering and performing treatments and interventions, ordering and review of laboratory studies, ordering and review of radiographic studies, pulse oximetry and re-evaluation of patient's condition.    Length of Stay: 4  Glori Bickers, MD  04/21/2017, 8:16 AM  Advanced Heart Failure Team Pager [phone] (M-F; 7a - 4p)  Please contact [facility] for night-coverage after hours (4p -7a ) and weekends on [email or web address]

## 2017-04-21 NOTE — Progress Notes (Signed)
ANTICOAGULATION CONSULT NOTE   Pharmacy Consult for heparin Indication: atrial fibrillation  Allergies  Allergen Reactions  . Oxycontin [Oxycodone Hcl] Shortness Of Breath, Nausea And Vomiting and Other (See Comments)    Reported by patient    Patient Measurements: Height: 5\' 10"  (177.8 cm) Weight: (!) 326 lb (147.9 kg) IBW/kg (Calculated) : 73 Heparin Dosing Weight: 115 kg  Vital Signs: Temp: 98 F (36.7 C) (08/19 0800) Temp Source: Axillary (08/19 0800) BP: 113/67 (08/19 0600) Pulse Rate: 104 (08/19 0900)  Labs:  Recent Labs  04/19/17 0319 04/19/17 1030  04/20/17 0406 04/20/17 0500 04/20/17 1545 04/21/17 0330  HGB 10.0*  --   --  10.4*  --   --  11.1*  HCT 31.0*  --   --  32.0*  --   --  34.5*  PLT 136*  --   --  123*  --   --  124*  LABPROT  --  35.4*  --  31.6*  --   --  28.4*  INR  --  3.43  --  2.98  --   --  2.61  CREATININE 4.06*  --   < >  --  2.55* 2.13* 2.08*  < > = values in this interval not displayed.  Estimated Creatinine Clearance: 45.4 mL/min (A) (by C-G formula based on SCr of 2.08 mg/dL (H)).   Medical History: Past Medical History:  Diagnosis Date  . A-fib (HCC)   . CAD (coronary artery disease)   . Diabetes (HCC)   . Dyslipidemia   . HTN (hypertension)   . Hx of CABG   . Pulmonary HTN (HCC)   . Right-sided heart failure (HCC)     Medications:  Infusions:  . sodium chloride Stopped (04/18/17 1800)  . sodium chloride    . sodium chloride    . amiodarone 60 mg/hr (04/21/17 0900)   Followed by  . amiodarone    . norepinephrine (LEVOPHED) Adult infusion 4 mcg/min (04/21/17 0915)  . dialysis replacement fluid (prismasate) 200 mL/hr at 04/20/17 1635  . dialysis replacement fluid (prismasate) 300 mL/hr at 04/20/17 1703  . dialysate (PRISMASATE) 2,000 mL/hr at 04/21/17 0717  . propofol (DIPRIVAN) infusion 50 mcg/kg/min (04/21/17 0900)  . sodium chloride      Assessment: 76 yo male on chronic Coumadin for afib, currently on hold.   Pharmacy asked to begin IV heparin once INR < 2.  Today's INR still elevated at 2.61.  Has not had any Coumadin since admission.  Discussed with Dr. Gala Romney, planning possible cardioversion soon.  Goal of Therapy:  Heparin level 0.3-0.7 units/ml Monitor platelets by anticoagulation protocol: Yes   Plan:  1. Recheck INR this afternoon at 2 PM. 2. Will start IV heparin when INR < 2.  Tad Moore, BCPS  Clinical Pharmacist Pager [phone]  04/21/2017 9:46 AM

## 2017-04-21 NOTE — Progress Notes (Signed)
Patient ID: William Strong, male   DOB: [birth year], 76 y.o.   MRN: [id number] S: No events overnight O:BP 113/67   Pulse (!) 103   Temp 98 F (36.7 C) (Axillary)   Resp (!) 28   Ht _0  (1.778 m)   Wt (!) 147.9 kg (326 lb)   SpO2 95%   BMI 46.78 kg/m   Intake/Output Summary (Last 24 hours) at 04/21/17 1057 Last data filed at 04/21/17 1000  Gross per 24 hour  Intake          2357.63 ml  Output             4900 ml  Net         -2542.37 ml   Intake/Output: I/O last 3 completed shifts: In: 3084.2 [I.V.:1764.2; NG/GT:1320] Out: 3606 [Urine:489; Other:7568]  Intake/Output this shift:  Total I/O In: 459.9 [I.V.:269.9; NG/GT:190] Out: 529 [Urine:10; Other:519] Weight change: 1.225 kg (2 lb 11.2 oz) Gen: intubated/sedated VPC:HEKBT Resp:occ rhonchi CYE:LYHTM, +BS, soft Ext: tr edema   Recent Labs Lab 04/28/2017 2322  04/18/17 0638 04/18/17 1621 04/19/17 0319 04/19/17 1621 04/20/17 0500 04/20/17 1545 04/21/17 0330  NA 140  < > 139 140 139 138 137 136 135  K 6.0*  < > 5.6* 4.5 4.3 4.6 4.4 4.4 4.6  CL 108  < > 107 106 105 104 104 103 101  CO2 22  < > 21* 20* _1 GLUCOSE 149*  < > 151* 94 136* 163* 133* 164* 150*  BUN 117*  < > 115* 114* 82* 63* 53* 45* 44*  CREATININE 6.23*  < > 6.20* 5.93* 4.06* 3.07* 2.55* 2.13* 2.08*  ALBUMIN 3.4*  --   --  3.1* 3.0* 3.0* 2.9* 2.8* 2.8*  CALCIUM 7.1*  < > 7.2* 7.3* 7.4* 7.4* 7.5* 7.8* 7.9*  PHOS  --   --   --  6.0* 5.0* 4.8* 4.1 3.9 4.4  AST 16  --   --   --   --   --   --   --   --   ALT 11*  --   --   --   --   --   --   --   --   < > = values in this interval not displayed. Liver Function Tests:  Recent Labs Lab 04/11/2017 2322  04/20/17 0500 04/20/17 1545 04/21/17 0330  AST 16  --   --   --   --   ALT 11*  --   --   --   --   ALKPHOS 84  --   --   --   --   BILITOT 0.9  --   --   --   --   PROT 6.6  --   --   --   --   ALBUMIN 3.4*  < > 2.9* 2.8* 2.8*  < > = values in this interval not displayed. No results  for input(s): LIPASE, AMYLASE in the last 168 hours. No results for input(s): AMMONIA in the last 168 hours. CBC:  Recent Labs Lab 04/27/2017 2322  04/19/17 0319 04/20/17 0406 04/21/17 0330  WBC 8.4  --  8.5 9.4 10.9*  NEUTROABS 6.8  --  5.8 7.1 8.1*  HGB 10.3*  < > 10.0* 10.4* 11.1*  HCT 32.2*  < > 31.0* 32.0* 34.5*  MCV 85.0  --  81.4 81.8 83.1  PLT 131*  --  136* 123* 124*  < > =  values in this interval not displayed. Cardiac Enzymes:  Recent Labs Lab 04/18/17 0451 04/18/17 0638  TROPONINI 0.05* 0.06*   CBG:  Recent Labs Lab 04/20/17 1549 04/20/17 1918 04/20/17 2352 04/21/17 0422 04/21/17 0807  GLUCAP 171* 167* 174* 119* 155*    Iron Studies: No results for input(s): IRON, TIBC, TRANSFERRIN, FERRITIN in the last 72 hours. Studies/Results: No results found. . chlorhexidine gluconate (MEDLINE KIT)  15 mL Mouth Rinse BID  . Chlorhexidine Gluconate Cloth  6 each Topical Daily  . feeding supplement (PRO-STAT SUGAR FREE 64)  60 mL Per Tube QID  . feeding supplement (VITAL HIGH PROTEIN)  1,000 mL Per Tube Q24H  . insulin aspart  2-6 Units Subcutaneous Q4H  . ipratropium-albuterol  3 mL Nebulization Q6H  . mouth rinse  15 mL Mouth Rinse 10 times per day  . pantoprazole sodium  40 mg Per Tube Daily  . sennosides  5 mL Per Tube BID  . sildenafil  20 mg Oral Q8H  . sodium chloride flush  10-40 mL Intracatheter Q12H  . sodium chloride flush  3 mL Intravenous Q12H    BMET    Component Value Date/Time   NA 135 04/21/2017 0330   K 4.6 04/21/2017 0330   CL 101 04/21/2017 0330   CO2 24 04/21/2017 0330   GLUCOSE 150 (H) 04/21/2017 0330   BUN 44 (H) 04/21/2017 0330   CREATININE 2.08 (H) 04/21/2017 0330   CALCIUM 7.9 (L) 04/21/2017 0330   GFRNONAA 30 (L) 04/21/2017 0330   GFRAA 34 (L) 04/21/2017 0330   CBC    Component Value Date/Time   WBC 10.9 (H) 04/21/2017 0330   RBC 4.15 (L) 04/21/2017 0330   HGB 11.1 (L) 04/21/2017 0330   HCT 34.5 (L) 04/21/2017 0330    PLT 124 (L) 04/21/2017 0330   MCV 83.1 04/21/2017 0330   MCH 26.7 04/21/2017 0330   MCHC 32.2 04/21/2017 0330   RDW 16.8 (H) 04/21/2017 0330   LYMPHSABS 0.9 04/21/2017 0330   MONOABS 1.8 (H) 04/21/2017 0330   EOSABS 0.1 04/21/2017 0330   BASOSABS 0.0 04/21/2017 0330    Background: This is a critically-ill 76 y/o M here from outside hospital with right heart failure, acute (oliguric) on chronic renal failure, acute on chronic respiratory failure and massive volume overload. He did not respond well to aggressive IV diuresis. CRRT initiated 04/18/17.  Assessment/Recommendations  1. Acute on Chronic Kidney Injury, Cardiorenal syndrome:CKD3[facility]4 at [facility] w/hx of DM2 and HTN. Here w/ oliguric renal failure most likely secondary to cardiorenal syndrome/shock. Now with improved UOP following UF with CVVHD and without IV diuresis. Cr 6.23 on admission and hyperkalemic to 6.1 however both improved on CRRT.He remains significantly volume overloaded.  1. Continue with CVVHDF for now and re-evaluate on Monday as discussed with Dr. Haroldine Laws. Goal CVP 12-15 2. I&O's, daily weights 3. Pt is a poor longterm dialysis candidate and Dr. Haroldine Laws has discussed this with the family who are aware and reasonable about his prognosis. 2. Right Heart Failure, preserved LVEF: Severe RV dysfunction noted on recent echo. Did not respond to aggressive diureses at outside hospital.  1. Per HF 3. Acute on Chronic Respiratory Failure, pHTN: On 4L O2 via [place] at home at baseline. Currently intubated. Worsening resp status no doubt secondary to volume status in setting of his chronic respiratory failure 1. CCM on, appreciate 2. Vent 3. Sildenafil 55m TID 4. Hypotension/Shock: Cardiogenic.  1. Back on Levophed. 2. Monitor hemodynamics closely 5. Hyperkalemia, Hyperphosphatemia: resolved  after CRRT 6. CAD with hx of CABG: Doesn't appear to be active CAD/ischemia currently. Monitor per cards 7. Atrial Fibrillation:?On  warfarin at home? 1. Will follow on telemetry 2. Heparin here.  Donetta Potts, MD [employer] [phone]

## 2017-04-21 NOTE — Progress Notes (Signed)
PULMONARY / CRITICAL CARE MEDICINE   Name: William Strong MRN: [id number] DOB: 04/08/1941    ADMISSION DATE:  April 29, 2017   CONSULTATION DATE:  2017-04-29  REFERRING MD:  Dr. Gala Romney   CHIEF COMPLAINT:  Respiratory Distress   HISTORY OF PRESENT ILLNESS:  76 y.o. male with past medical history of atrial fibrillation on systemic anticoagulation with Coumadin, OSA on CPAP therapy, chronic hypoxic respiratory failure on 4 L/m, chronic renal failure, CAD s/p CABG, DM, Dyslipidemia, HTN, Pulmonary HTN, and cor pulmonale. He presented to outside hospital with 3 days of progressive dyspnea. Patient aggressively diuresed with Bumex drip and Primacor drip. With continued worsening in clinical status as well as worsening renal function, hypercapnia, and respiratory acidosis patient was transferred to [facility] for further medical management. PCCM asked to consult for respiratory failure.  SUBJECTIVE:   No change in status Continues on CVVH  REVIEW OF SYSTEMS:  Unable to obtain given intubated status.  VITAL SIGNS: BP 113/67   Pulse (!) 104   Temp 98 F (36.7 C) (Axillary)   Resp (!) 28   Ht 5\' 10"  (1.778 m)   Wt (!) 147.9 kg (326 lb)   SpO2 95%   BMI 46.78 kg/m   HEMODYNAMICS: CVP:  [16 mmHg-29 mmHg] 16 mmHg  VENTILATOR SETTINGS: Vent Mode: PRVC FiO2 (%):  [50 %-60 %] 50 % Set Rate:  [28 bmp] 28 bmp Vt Set:  [580 mL] 580 mL PEEP:  [14 cmH20] 14 cmH20 Plateau Pressure:  [29 cmH20-31 cmH20] 29 cmH20  INTAKE / OUTPUT: I/O last 3 completed shifts: In: 3084.2 [I.V.:1764.2; NG/GT:1320] Out: 8057 [Urine:489; Other:7568]  PHYSICAL EXAMINATION: Gen:      No acute distress, obese HEENT:  EOMI, sclera anicteric, ETT in place Neck:     No masses; no thyromegaly Lungs:    Clear to auscultation bilaterally; normal respiratory effort CV:         Regular rate and rhythm; no murmurs Abd:      + bowel sounds; soft, non-tender; no palpable masses, no distension Ext:    No edema; adequate  peripheral perfusion Skin:      Warm and dry; no rash Neuro: Sedated, unresponsive  LABS:  BMET  Recent Labs Lab 04/20/17 0500 04/20/17 1545 04/21/17 0330  NA 137 136 135  K 4.4 4.4 4.6  CL 104 103 101  CO2 25 25 24   BUN 53* 45* 44*  CREATININE 2.55* 2.13* 2.08*  GLUCOSE 133* 164* 150*    Electrolytes  Recent Labs Lab 04/19/17 0319  04/20/17 0406 04/20/17 0500 04/20/17 1545 04/21/17 0330  CALCIUM 7.4*  < >  --  7.5* 7.8* 7.9*  MG 2.3  --  2.3  --   --  2.4  PHOS 5.0*  < >  --  4.1 3.9 4.4  < > = values in this interval not displayed.  CBC  Recent Labs Lab 04/19/17 0319 04/20/17 0406 04/21/17 0330  WBC 8.5 9.4 10.9*  HGB 10.0* 10.4* 11.1*  HCT 31.0* 32.0* 34.5*  PLT 136* 123* 124*    Coag's  Recent Labs Lab 29-Apr-2017 2322 04/19/17 1030 04/20/17 0406 04/21/17 0330  APTT 45*  --   --   --   INR 2.98 3.43 2.98 2.61    Sepsis Markers  Recent Labs Lab 29-Apr-2017 2322 04/18/17 0319 04/18/17 1026  LATICACIDVEN 0.8 1.0 0.55    ABG  Recent Labs Lab 04/18/17 1849 04/19/17 0408 04/20/17 0419  PHART 7.355 7.396 7.465*  PCO2ART 37.9 37.7 36.0  PO2ART 69.0* 67.0* 64.0*    Liver Enzymes  Recent Labs Lab 04/28/2017 2322  04/20/17 0500 04/20/17 1545 04/21/17 0330  AST 16  --   --   --   --   ALT 11*  --   --   --   --   ALKPHOS 84  --   --   --   --   BILITOT 0.9  --   --   --   --   ALBUMIN 3.4*  < > 2.9* 2.8* 2.8*  < > = values in this interval not displayed.  Cardiac Enzymes  Recent Labs Lab 04/18/17 0451 04/18/17 0638  TROPONINI 0.05* 0.06*    Glucose  Recent Labs Lab 04/20/17 1108 04/20/17 1549 04/20/17 1918 04/20/17 2352 04/21/17 0422 04/21/17 0807  GLUCAP 154* 171* 167* 174* 119* 155*    Imaging No results found.   STUDIES:  PORT CXR 8/16:  Previously reviewed by me. Endotracheal tube in goodpositon. Enteric feeding tube coursing below the diaphragm. Bilateral hilar fullness. Elevation of left hemidiaphragm.  Pulmonary arterial catheter appears slightly coiled. RENAL U/S 8/16:  Unable to visualize left kidney due to overlying gas and limitation in patient motion or ability to cooperate.  Cyst arising from upper pole right kidney. Right kidney otherwise appears unremarkable. Urinary bladder decompressed and unable to assess. VENOUS DUPLEX LLE 8/16:  No DVT or SVT. TTE 8/16:  LV moderately dilated with EF 55-60%. Cannot exclude wall motion abnormality although none seen. Insufficient to assess diastolic function. LA moderately dilated & RA severely dilated. RV not well visualized but overall severely dilated with severely reduced systolic function. Pulmonary artery systolic pressure 61 mmHg. Aortic valve poorly visualized but no stenosis or regurgitation seen. Aortic root normal in size. No mitral stenosis or regurgitation. Paradoxical septal motion. Mild pulmonic regurgitation with poorly visualized valve. Moderate tricuspid regurgitation. No pericardial effusion. PORT CXR 8/17: Enteric feeding tube coursing below diaphragm. Endotracheal tube in acceptable position. Left internal jugular central venous catheter in good position. Right central venous catheter removed. I have reviewed the images personally  MICROBIOLOGY: MRSA PCR 8/15:  Negative   ANTIBIOTICS: None.   SIGNIFICANT EVENTS: 8/15 - Transferred from OSH & intubated w/ worsening respiratory status & encephalopathy 8/16 - PA catheter placed & then discontinued w/ PCWP 14. Started on CVVHD. 8/17 - Pulling 100-150cc on CVVHD & off pressor.  LINES/TUBES: R IJ PA CATHETER 8/16 (out same day) OETT 8/16 >>> L IJ TEMP HD CATHETER 8/16 >>> L RADIAL ART LINE >>> OGT 8/16 >>> FOLEY 8/16 >>> PIV  ASSESSMENT / PLAN: 76 y.o. male with pulmonary hypertension, decompensated rt heart failure, acute cor pulmonale, acute on chronic renal failure, and acute on chronic hypoxic respiratory failure.  On vol removal via CVVH  PULMONARY A: Acute on  chronic hypoxic respiratory failure: Multifactorial in the setting of pulmonary edema and pulmonary hypertension. Uses 4 L/m at home. Acute hypercarbic respiratory failure: Question possible underlying COPD. Pulmonary hypertension: Multifactorial. OSA: On CPAP therapy at home. Possible COPD: Chronic tobacco use. Tobacco use disorder: Quit smoking tobacco 7 months prior to admission.  P:   Continue full vent support.  Wean down PEEP and Fio2. Maintain sats > 92% Follow chest x-ray Continue revatio  CARDIOVASCULAR A:  Acute on chronic congestive heart failure: History of diastolic congestive heart failure.  Cor pulmonale: Question acute versus chronic. H/O CAD s/p CABG, HTN, HLD, paroxysmal atrial fibrillation   P:  Continue amio Wean levo off as tolearted Volume  removal via CVVH  RENAL A:   Acute on chronic renal failure stage IV: Multifactorial. Known history of urinary retention. Hyperkalemia: Mild. Improving. S/P Kayexalate. History of urinary retention  Metabolic acidosis: Mild.  P:   Continue CVVH Follow urine output  GASTROINTESTINAL A:   No acute issues.   P:   Nothing by mouth  Tube feeds  HEMATOLOGIC A:   Anemia: Likely secondary to chronic disease. No evidence of active bleeding. Coagulopathy: Secondary to chronic anticoagulation with Coumadin.  Thrombocytopenia: Mild.  P:  Follow CBC  INFECTIOUS A:   No acute issues.  P:   Observe off antibiotics  ENDOCRINE A:   DM:  Glucose controlled.  P:   SSI coverage  NEUROLOGIC A:   Acute encephalopathy: Likely multifactorial from hypoxia and hypercarbia. Sedation on ventilator   P:   RASS goal: 0 to -1 Propofol gtt Fentanyl, versed PRN  Prophylaxis:  Heparin gtt protonix Diet: Tube feeds Code Status:  Full code Disposition:  Remain in ICU Family Update: Daughter updated 8/18 at bedside. No family at bedside 8/19  The patient is critically ill with multiple organ system failure and  requires high complexity decision making for assessment and support, frequent evaluation and titration of therapies, advanced monitoring, review of radiographic studies and interpretation of complex data.   Critical Care Time devoted to patient care services, exclusive of separately billable procedures, described in this note is 35 minutes.   Chilton Greathouse MD [facility] Pager [phone] If no answer or after 3pm call: [phone] 04/21/2017, 9:36 AM

## 2017-04-21 NOTE — Plan of Care (Signed)
Problem: Skin Integrity: Goal: Risk for impaired skin integrity will decrease Outcome: Progressing q2h turns, no signs of skin breakdown at this time  Problem: Fluid Volume: Goal: Ability to maintain a balanced intake and output will improve Outcome: Progressing CRRT maintaining -100/hr goal. CVP 16.  Problem: Nutrition: Goal: Adequate nutrition will be maintained Outcome: Progressing Continuous tube feeds being tolerated

## 2017-04-21 NOTE — Progress Notes (Addendum)
ANTICOAGULATION CONSULT NOTE   Pharmacy Consult for heparin Indication: atrial fibrillation  Allergies  Allergen Reactions  . Oxycontin [Oxycodone Hcl] Shortness Of Breath, Nausea And Vomiting and Other (See Comments)    Reported by patient    Patient Measurements: Height: 5\' 10"  (177.8 cm) Weight: (!) 326 lb (147.9 kg) IBW/kg (Calculated) : 73 Heparin Dosing Weight: 115 kg  Vital Signs: Temp: 98 F (36.7 C) (08/19 1200) Temp Source: Axillary (08/19 1200) BP: 113/67 (08/19 0600) Pulse Rate: 94 (08/19 1500)  Labs:  Recent Labs  04/19/17 0319  04/20/17 0406 04/20/17 0500 04/20/17 1545 04/21/17 0330 04/21/17 1332  HGB 10.0*  --  10.4*  --   --  11.1*  --   HCT 31.0*  --  32.0*  --   --  34.5*  --   PLT 136*  --  123*  --   --  124*  --   LABPROT  --   < > 31.6*  --   --  28.4* 26.4*  INR  --   < > 2.98  --   --  2.61 2.38  CREATININE 4.06*  < >  --  2.55* 2.13* 2.08*  --   < > = values in this interval not displayed.  Estimated Creatinine Clearance: 45.4 mL/min (A) (by C-G formula based on SCr of 2.08 mg/dL (H)).   Medical History: Past Medical History:  Diagnosis Date  . A-fib (HCC)   . CAD (coronary artery disease)   . Diabetes (HCC)   . Dyslipidemia   . HTN (hypertension)   . Hx of CABG   . Pulmonary HTN (HCC)   . Right-sided heart failure (HCC)     Medications:  Infusions:  . sodium chloride Stopped (04/18/17 1800)  . sodium chloride    . sodium chloride    . amiodarone 60 mg/hr (04/21/17 1500)   Followed by  . amiodarone    . norepinephrine (LEVOPHED) Adult infusion 4 mcg/min (04/21/17 1500)  . dialysis replacement fluid (prismasate) 200 mL/hr at 04/20/17 1635  . dialysis replacement fluid (prismasate) 300 mL/hr at 04/21/17 1202  . dialysate (PRISMASATE) 2,000 mL/hr at 04/21/17 1303  . propofol (DIPRIVAN) infusion 50 mcg/kg/min (04/21/17 1500)  . sodium chloride     Assessment: 76 yo male on chronic Coumadin for afib, currently on hold.   Pharmacy asked to begin IV heparin once INR < 2.2. INR 2.1 this evening.  Has not had any Coumadin since admission.  Discussed with Dr. Gala Romney, planning cardioversion tomorrow. Heparin infusion will be started at ~14 units/kg/hr without bolus.   Goal of Therapy:  Heparin level 0.3-0.7 units/ml Monitor platelets by anticoagulation protocol: Yes   Plan:  1. Start heparin gtt at 1500 units/hr 2. Monitor for s/sx of bleeding 3. Daily heparin level and CBC  Ruben Im, PharmD Clinical Pharmacist 04/21/2017 9:20 PM

## 2017-04-22 ENCOUNTER — Inpatient Hospital Stay (HOSPITAL_COMMUNITY): Payer: Medicare Other

## 2017-04-22 DIAGNOSIS — I469 Cardiac arrest, cause unspecified: Secondary | ICD-10-CM

## 2017-04-22 LAB — RENAL FUNCTION PANEL
ALBUMIN: 2.7 g/dL — AB (ref 3.5–5.0)
ANION GAP: 9 (ref 5–15)
BUN: 38 mg/dL — ABNORMAL HIGH (ref 6–20)
CO2: 25 mmol/L (ref 22–32)
Calcium: 8 mg/dL — ABNORMAL LOW (ref 8.9–10.3)
Chloride: 100 mmol/L — ABNORMAL LOW (ref 101–111)
Creatinine, Ser: 1.72 mg/dL — ABNORMAL HIGH (ref 0.61–1.24)
GFR, EST AFRICAN AMERICAN: 43 mL/min — AB (ref >60)
GFR, EST NON AFRICAN AMERICAN: 37 mL/min — AB (ref >60)
Glucose, Bld: 128 mg/dL — ABNORMAL HIGH (ref 65–99)
PHOSPHORUS: 3.1 mg/dL (ref 2.5–4.6)
POTASSIUM: 4.2 mmol/L (ref 3.5–5.1)
Sodium: 134 mmol/L — ABNORMAL LOW (ref 135–145)

## 2017-04-22 LAB — BLOOD GAS, ARTERIAL
Acid-base deficit: 1.7 mmol/L (ref 0.0–2.0)
Bicarbonate: 23.9 mmol/L (ref 20.0–28.0)
DRAWN BY: 419771
FIO2: 100
MECHVT: 580 mL
O2 Saturation: 84.1 %
PEEP: 10 cmH2O
Patient temperature: 98.6
RATE: 28 resp/min
pCO2 arterial: 50.8 mmHg — ABNORMAL HIGH (ref 32.0–48.0)
pH, Arterial: 7.294 — ABNORMAL LOW (ref 7.350–7.450)
pO2, Arterial: 58.1 mmHg — ABNORMAL LOW (ref 83.0–108.0)

## 2017-04-22 LAB — PROTIME-INR
INR: 1.91
PROTHROMBIN TIME: 22.1 s — AB (ref 11.4–15.2)

## 2017-04-22 LAB — MAGNESIUM: Magnesium: 2.7 mg/dL — ABNORMAL HIGH (ref 1.7–2.4)

## 2017-04-22 LAB — GLUCOSE, CAPILLARY
GLUCOSE-CAPILLARY: 126 mg/dL — AB (ref 65–99)
GLUCOSE-CAPILLARY: 143 mg/dL — AB (ref 65–99)
GLUCOSE-CAPILLARY: 145 mg/dL — AB (ref 65–99)
GLUCOSE-CAPILLARY: 167 mg/dL — AB (ref 65–99)
Glucose-Capillary: 122 mg/dL — ABNORMAL HIGH (ref 65–99)

## 2017-04-22 LAB — CBC WITH DIFFERENTIAL/PLATELET
BASOS ABS: 0 10*3/uL (ref 0.0–0.1)
Basophils Relative: 0 %
Eosinophils Absolute: 0.1 10*3/uL (ref 0.0–0.7)
Eosinophils Relative: 1 %
HEMATOCRIT: 32.9 % — AB (ref 39.0–52.0)
Hemoglobin: 10.7 g/dL — ABNORMAL LOW (ref 13.0–17.0)
LYMPHS ABS: 1.4 10*3/uL (ref 0.7–4.0)
Lymphocytes Relative: 12 %
MCH: 26.8 pg (ref 26.0–34.0)
MCHC: 32.5 g/dL (ref 30.0–36.0)
MCV: 82.5 fL (ref 78.0–100.0)
Monocytes Absolute: 1.8 10*3/uL — ABNORMAL HIGH (ref 0.1–1.0)
Monocytes Relative: 15 %
NEUTROS ABS: 8.2 10*3/uL — AB (ref 1.7–7.7)
Neutrophils Relative %: 72 %
Platelets: 147 10*3/uL — ABNORMAL LOW (ref 150–400)
RBC: 3.99 MIL/uL — AB (ref 4.22–5.81)
RDW: 16.7 % — ABNORMAL HIGH (ref 11.5–15.5)
WBC: 11.4 10*3/uL — AB (ref 4.0–10.5)

## 2017-04-22 LAB — HEPARIN LEVEL (UNFRACTIONATED)
Heparin Unfractionated: 0.27 IU/mL — ABNORMAL LOW (ref 0.30–0.70)
Heparin Unfractionated: 0.34 IU/mL (ref 0.30–0.70)

## 2017-04-22 MED ORDER — FUROSEMIDE 10 MG/ML IJ SOLN
160.0000 mg | Freq: Four times a day (QID) | INTRAVENOUS | Status: DC
  Administered 2017-04-22 (×2): 160 mg via INTRAVENOUS
  Filled 2017-04-22: qty 16
  Filled 2017-04-22 (×2): qty 10
  Filled 2017-04-22 (×2): qty 16

## 2017-04-22 MED ORDER — MIDAZOLAM HCL 50 MG/10ML IJ SOLN
0.0000 mg/h | INTRAMUSCULAR | Status: DC
  Administered 2017-04-22: 5 mg/h via INTRAVENOUS
  Filled 2017-04-22: qty 10

## 2017-04-22 MED ORDER — MIDAZOLAM BOLUS VIA INFUSION
5.0000 mg | Freq: Once | INTRAVENOUS | Status: AC
  Administered 2017-04-22: 5 mg via INTRAVENOUS
  Filled 2017-04-22: qty 5

## 2017-04-22 MED ORDER — FENTANYL 2500MCG IN NS 250ML (10MCG/ML) PREMIX INFUSION
25.0000 ug/h | INTRAVENOUS | Status: DC
  Administered 2017-04-22: 200 ug/h via INTRAVENOUS
  Filled 2017-04-22: qty 250

## 2017-04-22 MED ORDER — FENTANYL CITRATE (PF) 100 MCG/2ML IJ SOLN
200.0000 ug | Freq: Once | INTRAMUSCULAR | Status: AC
  Administered 2017-04-22: 200 ug via INTRAVENOUS

## 2017-04-22 MED ORDER — MIDAZOLAM BOLUS VIA INFUSION
1.0000 mg | INTRAVENOUS | Status: DC | PRN
  Filled 2017-04-22: qty 2

## 2017-04-22 MED ORDER — FENTANYL BOLUS VIA INFUSION
25.0000 ug | INTRAVENOUS | Status: DC | PRN
  Filled 2017-04-22: qty 25

## 2017-04-22 MED ORDER — LIDOCAINE IN D5W 4-5 MG/ML-% IV SOLN
1.0000 mg/min | INTRAVENOUS | Status: DC
  Administered 2017-04-22: 1 mg/min via INTRAVENOUS

## 2017-04-24 MED FILL — Medication: Qty: 1 | Status: AC

## 2017-05-04 NOTE — Progress Notes (Signed)
2017/05/15  0500   Patient PEA arrested at 0430. Code blue initiated. CCM-Dr [id number] at bedside. See Code documentation.Code ended with ROSC at 0445 Dr Gala Romney and Dr Donnie Aho made aware. Patient daughter Myrtis Ser updated and suggested that she return from [place]. Will continue to update.    Carlyon Prows RN

## 2017-05-04 NOTE — Progress Notes (Signed)
  Spoke with family. They have decided on comfort care. Will stop CVVHD and terminally extubate. Start versed and fentanyl for comfort.   Arvilla Meres, MD  3:39 PM

## 2017-05-04 NOTE — Death Summary Note (Signed)
  Advanced Heart Failure Death Summary  Death Summary   Patient ID: William Strong MRN: [id number], DOB/AGE: [birth year] 74 y.o. Admit date: 04/26/2017 D/C date:     May 13, 2017   Primary Discharge Diagnoses:  1. R sided HF/pulm HTN -> End stage Cor-pulmonale 2. PEA arrest 3. VT 4. Acute respiratory failure 5. 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 CAD h/o CABG 7. OHS/pulm HTN 8. Chronic Afib 9. Hyperkalemia  Hospital Course:   William Strong was a 76 y.o. male transferred from [place], [place] 04/10/2017 with R sided HF, pulm HTN, ARF, and acute respiratory failure.   Pt failed BiPAP on arrival and extubated with respiratory acidosis.  [place]wan placed to monitor hemodynamics and cardiac output. Trialysis catheter placed with ARF on CKD III-IV and planned for CVVHD.   Pt initially responded well to CVVHD. Discussed with family that with end stage lung, heart, and kidney disease, if he failed CVVHD there would be no other medical therapies to pursue. Pt continued to slowly dialyze. CVP slowly improved from the > 20 range to 16. Urine output remained poor on CVVHD. Decision made to continue CVVHD through weekend and re-assess for prognosis on Monday, 2017/05/13. Pt made partial code with ACLS medications and NO CPR.   Morning of 2017/05/13 pt went into PEA arrest. ROSC achieved with ACLS medications, but patient then went into VT requiring shock x 3. Started on lidocaine gtt and amiodarone bolused with return to NSR.   With cardiac arrest, clinical worsening, and no clear means of recovery, decision was made to transition to comfort care after long discussion with family. Pt had been verbal previously with family that he would not want tracheostomy or heroic, life prolonging measures.   Pt terminally extubated at 1600, 2017/05/13 and passed away shortly after with family at bedside.   Every effort was made during this admission to improve patients clinical picture.  Ultimately, William Strong passed away from multiple system  organ failure in the setting of end stage heart failure, pulmonary HTN, and renal disease.   Duration of Discharge Encounter: Greater than 35 minutes   Signed, Graciella Freer, PA-C 04/23/2017, 7:35 AM  Agree with above.  Arvilla Meres, MD  5:27 PM

## 2017-05-04 NOTE — Progress Notes (Signed)
ANTICOAGULATION CONSULT NOTE - Follow Up Consult  Pharmacy Consult for Heparin  Indication: atrial fibrillation  Allergies  Allergen Reactions  . Oxycontin [Oxycodone Hcl] Shortness Of Breath, Nausea And Vomiting and Other (See Comments)    Reported by patient    Patient Measurements: Height: 5\' 10"  (177.8 cm) Weight: (!) 321 lb (145.6 kg) IBW/kg (Calculated) : 73  Vital Signs: Temp: 97.6 F (36.4 C) (08/20 0310) Temp Source: Oral (08/20 0310) Pulse Rate: 93 (08/20 0310)  Labs:  Recent Labs  04/20/17 0406  04/20/17 1545 04/21/17 0330 04/21/17 1332 04/21/17 1532 04/21/17 2000 04/30/2017 0300  HGB 10.4*  --   --  11.1*  --   --   --  10.7*  HCT 32.0*  --   --  34.5*  --   --   --  32.9*  PLT 123*  --   --  124*  --   --   --  147*  LABPROT 31.6*  --   --  28.4* 26.4*  --  24.0* 22.1*  INR 2.98  --   --  2.61 2.38  --  2.11 1.91  HEPARINUNFRC  --   --   --   --   --   --   --  0.27*  CREATININE  --   < > 2.13* 2.08*  --  1.89*  --   --   < > = values in this interval not displayed.  Estimated Creatinine Clearance: 49.5 mL/min (A) (by C-G formula based on SCr of 1.89 mg/dL (H)).   Assessment: 76 y/o M on heparin for afib now that INR is <2. Initial heparin level this AM is just below therapeutic range. Possible DCCV today.   Goal of Therapy:  Heparin level 0.3-0.7 units/ml Monitor platelets by anticoagulation protocol: Yes   Plan:  -Inc heparin to 1600 units/hr -1200 HL  Abran Duke 04/17/2017,4:24 AM

## 2017-05-04 NOTE — Progress Notes (Signed)
Nurse called that patient had PEA arrest and coded.  Eventually cardioverted CCM with patient during code.  Reported still intubated and stable on drip now on lidocaine and additional amiodarone bolus. Nurse reports sinus rhythm now and stable BP.    Dr. Leonard Schwartz note read.  End stage cor pulmonale.  Talk of comfort care if does not improve.  Not much else to add at the present since stable again.  Darden Palmer MD [facility] 5:34 AM .

## 2017-05-04 NOTE — Progress Notes (Signed)
Advanced Heart Failure Rounding Note  Primary Cardiologist: [facility] HF: NEW (Dr. Haroldine Laws)   Subjective:    Transferred from [place], [place] on 8/15 with R sided HF, pulm HTN, ARF and acute respiratory failure.   Intubated 04/26/2017 with pH 7.1.  Trialysis cath placed 04/18/17 and started on CRRT.   PEA arrest overnight. ACLS started by bedside RNs with rapid ROSC. Pt then developed VT requiring shock x 3. Received IV amiodarone and lidocaine with conversion to NSR.   CVVHD holding even now with arrest over night. Remains on lidocaine at 1, levophed at 3, and amiodarone at 60 mg/hr.   Weight shows down 5 lbs. Only 190 cc of UP yesterday.   Remains intubated and sedated.  CVP up to 17-18  Rhythm appears to be atrial tach vs NSR this am.   Studies:  Echo 04/18/17 LVEF 55-60%, Mod LAE, Severely dilated RV. RV function severely reduced. RA severely dilated.   Echo 04/16/17 (in [place]) - Mildly dilated LV w/o LVH, EF est 55-60%, mildly dilated RV w/ severe RV systolic dysfunction, mildly dilated LA, severely dilated RA, mild MAC w/ mild MR, mild TR, mild pulm HTN w/ est PAS ~44mHg  RHC 09-24-16  RA 25/32 mean 281mg RV 80109m w/ EDP 107m50mPA 80/35 with mean 52mm46mCW 11/15 with mean of 10mmH40m 6.5 L/min, CI 2.5, PVR 6.1 Wood units  Objective:   Weight Range: (!) 321 lb (145.6 kg) Body mass index is 46.06 kg/m.   Vital Signs:   Temp:  [97.6 F (36.4 C)-98.1 F (36.7 C)] 98.1 F (36.7 C) (08/20 0749) Pulse Rate:  [75-125] 88 (08/20 0645) Resp:  [20-36] 29 (08/20 0645) BP: (110-137)/(48-52) 137/48 (08/20 0817) SpO2:  [91 %-100 %] 100 % (08/20 0817) Arterial Line BP: (92-142)/(7-61) 120/53 (08/20 0645) FiO2 (%):  [50 %-100 %] 80 % (08/20 0817) Weight:  [321 lb (145.6 kg)] 321 lb (145.6 kg) (08/20 0310) Last BM Date: 04/20/17  Weight change: Filed Weights   04/20/17 0258 04/21/17 0207 04/23/2017-09-10 Weight: (!) 323 lb 4.8 oz (146.6 kg) (!) 326 lb  (147.9 kg) (!) 321 lb (145.6 kg)    Intake/Output:   Intake/Output Summary (Last 24 hours) at 04/22/08-10-2018Last data filed at 04/22/08/10/18 Gross per 24 hour  Intake          2545.99 ml  Output             4706 ml  Net         -2160.01 ml      Physical Exam    General: Intubated and sedated.  HEENT: Normal + ETT Neck: Supple. JVP difficult.  Carotids 2+ bilat; no bruits. No thyromegaly or nodule noted. Cor: PMI nondisplaced. Tachy regular + RV lift.  Lungs: Clear anteriorly. + mechanical ventilation sounds.  Abdomen: Obese, soft, non-distended, no HSM. No bruits or masses. +BS  Extremities: No cyanosis, clubbing, rash, R and LLE no edema.  Neuro: Intubated and sedated.   Telemetry   Personally reviewed, Possible Atach vs NSR 90s. PEA overnight with VT and shock x 3.   EKG    Previously reviewed, Probably NSR with RBBB. Low volts.   Labs    CBC  Recent Labs  04/21/17 0330 04/22/09-Sep-2018 WBC 10.9* 11.4*  NEUTROABS 8.1* 8.2*  HGB 11.1* 10.7*  HCT 34.5* 32.9*  MCV 83.1 82.5  PLT 124* 147*  500sic Metabolic Panel  Recent Labs  04/21/17 0330 04/21/17  1532 04-28-2017 0300  NA 135 132* 134*  K 4.6 4.4 4.2  CL 101 102 100*  CO2 24 25 25   GLUCOSE 150* 169* 128*  BUN 44* 42* 38*  CREATININE 2.08* 1.89* 1.72*  CALCIUM 7.9* 7.7* 8.0*  MG 2.4  --  2.7*  PHOS 4.4 3.5 3.1   Liver Function Tests  Recent Labs  04/21/17 1532 04-28-2017 0300  ALBUMIN 2.7* 2.7*   No results for input(s): LIPASE, AMYLASE in the last 72 hours. Cardiac Enzymes No results for input(s): CKTOTAL, CKMB, CKMBINDEX, TROPONINI in the last 72 hours.  BNP: BNP (last 3 results)  Recent Labs  04/03/2017 2322  BNP 296.5*    ProBNP (last 3 results) No results for input(s): PROBNP in the last 8760 hours.   D-Dimer No results for input(s): DDIMER in the last 72 hours. Hemoglobin A1C No results for input(s): HGBA1C in the last 72 hours. Fasting Lipid Panel  Recent Labs   04/21/17 0330  TRIG 110   Thyroid Function Tests  Recent Labs  04/19/17 1030  TSH 1.995    Other results:   Imaging    Dg Chest Port 1 View  Result Date: 04/28/2017 CLINICAL DATA:  Check endotracheal tube placement EXAM: PORTABLE CHEST 1 VIEW COMPARISON:  04/19/2017 FINDINGS: Cardiac shadow remains enlarged. Endotracheal tube, left jugular central line and nasogastric catheter are again seen and stable. The lungs are well aerated bilaterally. Increasing vascular congestion is noted. Some slight worsening in the degree of pulmonary edema is noted. No focal confluent infiltrate is seen. IMPRESSION: Slight increase in degree of CHF when compared with the prior exam. Electronically Signed   By: Inez Catalina M.D.   On: April 28, 2017 07:38     Medications:     Scheduled Medications: . chlorhexidine gluconate (MEDLINE KIT)  15 mL Mouth Rinse BID  . Chlorhexidine Gluconate Cloth  6 each Topical Daily  . feeding supplement (PRO-STAT SUGAR FREE 64)  60 mL Per Tube QID  . feeding supplement (VITAL HIGH PROTEIN)  1,000 mL Per Tube Q24H  . insulin aspart  2-6 Units Subcutaneous Q4H  . ipratropium-albuterol  3 mL Nebulization Q6H  . mouth rinse  15 mL Mouth Rinse 10 times per day  . pantoprazole sodium  40 mg Per Tube Daily  . sennosides  5 mL Per Tube BID  . sildenafil  20 mg Oral Q8H  . sodium chloride flush  10-40 mL Intracatheter Q12H  . sodium chloride flush  3 mL Intravenous Q12H    Infusions: . sodium chloride Stopped (04/18/17 1800)  . sodium chloride    . sodium chloride    . amiodarone 60 mg/hr (04-28-17 0700)  . furosemide 160 mg (28-Apr-2017 0834)  . heparin 1,600 Units/hr (2017-04-28 0700)  . lidocaine 1 mg/min (04-28-2017 0700)  . norepinephrine (LEVOPHED) Adult infusion 3 mcg/min (28-Apr-2017 0700)  . dialysis replacement fluid (prismasate) 200 mL/hr at 04/21/17 1549  . dialysis replacement fluid (prismasate) 300 mL/hr at Apr 28, 2017 0512  . dialysate (PRISMASATE) 2,000 mL/hr at  Apr 28, 2017 0100  . propofol (DIPRIVAN) infusion Stopped (2017-04-28 0432)  . sodium chloride      PRN Medications: sodium chloride, sodium chloride, Place/Maintain arterial line **AND** sodium chloride, bisacodyl, fentaNYL (SUBLIMAZE) injection, heparin, midazolam, ondansetron (ZOFRAN) IV, sodium chloride, sodium chloride flush, sodium chloride flush  Previous Imaging  Nuc stress 09-05-16 showed no significant ischemia; est LVEF 47%  RHC 09-24-16 RA 25/32 mean 47mHg RV 840mg w/ EDP 2553m PA 80/35 with mean 17m4mPCW 11/15  with mean of 16mHg CO 6.5 L/min, CI 2.5, PVR 6.1 Wood units  LHC 01-30-12 LM nl, 50% mLAD, CTO ostial LCx, 80% mRCA Grafts: LIMA-LAD atrentic distally, SVG-OM1 patent, filling OM2 retrograde with ~95% in OM2 (small vessel not amenable for intervention), SVG-PDA patent with LI  Patient Profile   SKhallid Pasillasis a 76y.o. male with h/o CAD s/p CABG in 2008 at [facility] (LIMA-LAD known to be atretic, SVG-OM1, SVG-PDA), R sided HF w/ preserved LVEF, pulm HTN (severe on RHC Jan 2018), chronic renal failure, parox afib, HTN, dyslipidemia, DM2.  [place] and acute respiratory failure in setting of chronic RV failure and cor pulmonale  Assessment/Plan   1. 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 Wean as tolerated   2. Acute respiratory failure - likely due to OHS/OSA and cor pulmonale - Intubated and sedated. Appreciate CCM support.   - Vent back up to 80% FIO2 and PEEP 15. Wean as tolerated. Hopefully we can try for extubation early this week.  - Family says he would refuse trach  3. 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 CVP 17-18. Goal 12-15. Will need to re-initiate CVVHD once stable if that is the plan. Dr. DJimmy Footmanto start on lasix.  - Not HD candidate. Will have to  wait and see if he gets any renal recovery. If not would be comfort care.  4. CAD: h/o CABG.  - No acute issues or active CAD a this time.  - LVEF is preserved. No change.   5. OHS/pulm HTN:  - Appreciate CCM support.  - No change. Refuses trach   6. Afib:  - Per notes patient has chronic Afib and has been on chronic coumadin.  - CHA2DS2/VASc is at least 4.  - Rhythm seems to be atrial tach.  - Continue IV amiodarone. Would drop to 30 mg/hr later this am.  - INR 1.91 this am. Continue heparin.   7. Hyperkalemia - Resolved on CVVHD.   8. Code status  - He remains very tenuous, even more so after arrest overnight.  - Long talk with his daughter and Dr. NAshok Cordiain CBenton He has end-stage cor pulmonale and tenuous kidney function at baseline. He will be at high-risk for recurrent decompensation and death regardless of outcome of this admission.  - We discussed code status again. Continue vent over the weekend. Ok with shock. No CPR.  -He would not want trach and will not be candidate for outpatient HD.  -Will continue CVVHD and intubation today. Reassess for possible extubation early this week. If no progress may need to move to comfort care.   Pt remains critically ill. With code over night will need to re-address code status once family arrives. If no CPR still, will need to change Code status to partial in epic. If plan to continue aggressive care, would plan on re-initiating CVVHD to continue to move towards extubation if possible.   Length of Stay: 5  MAnnamaria Helling 809-04-2017 8:57 AM  Advanced Heart Failure Team Pager [phone](M-F; 7a - 4p)  Please contact [facility] for night-coverage after hours (4p -7a ) and weekends on [email or web address]  Agree.  He remains critically ill. Had PEA arrest overnight followed by VT. Requiring defibrillation. Now on 80% FiO2 and norepinephrine  for support. Remains on CVVHD. Minimal urine output. Now back in NSR  Exam Intubated  sedated.Chronically ill appearing Left IJ trialysis Cor RRR Lungs: coarse Ab: obese mildly distended +BS Extremities: warm trace edema  He continues with multi-system organ failure now s/p cardiac arrest overnight. 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 Will discuss with family regarding possible transition to comfort care.   CRITICAL CARE Performed by: Glori Bickers  Total critical care time: 45 minutes  Critical care time was exclusive of separately billable procedures and treating other patients.  Critical care was necessary to treat or prevent imminent or life-threatening deterioration.  Critical care was time spent personally by me (independent of midlevel providers or residents) on the following activities: development of treatment plan with patient and/or surrogate as well as nursing, discussions with consultants, evaluation of patient's response to treatment, examination of patient, obtaining history from patient or surrogate, ordering and performing treatments and interventions, ordering and review of laboratory studies, ordering and review of radiographic studies, pulse oximetry and re-evaluation of patient's condition.  Glori Bickers, MD  12:54 PM

## 2017-05-04 NOTE — Progress Notes (Signed)
   May 10, 2017 1600  Clinical Encounter Type  Visited With Patient and family together  Visit Type Critical Care  Spiritual Encounters  Spiritual Needs Emotional  Stress Factors  Patient Stress Factors Health changes  Family Stress Factors Loss  Paged by nursing on extubation. Family declined chaplain services.

## 2017-05-04 NOTE — Progress Notes (Addendum)
CRRT and current drips discontinued. Fentanyl and versed bolus plus drips started. Pt extubated by Dr. Gala Romney at 1600. Time of death 43. Family at the bedside, chaplain support refused. Emotional support provided.

## 2017-05-04 NOTE — Progress Notes (Signed)
41ml of Versed and of Fentanyl wasted in sink with Angelique Holm, RN.

## 2017-05-04 NOTE — Progress Notes (Signed)
Called to the bedside for PEA arrest.  Patient developed bradycardia that progressed to PEA arrest, ACLS started by the bedside nurses with rapid ROSC. Patient then developed V tach that required cardioversion x3, received amiodarone and lidocaine boluses. Patient converted to NSR. Now on both amiodarone and lidocaine drip.  William Milian, MD CCM attending

## 2017-05-04 NOTE — Progress Notes (Signed)
PULMONARY / CRITICAL CARE MEDICINE   Name: William Strong MRN: [id number] DOB: 1941/08/17    ADMISSION DATE:  04/12/2017   CONSULTATION DATE:  05/03/2017  REFERRING MD:  Dr. Gala Romney   CHIEF COMPLAINT:  Respiratory Distress   HISTORY OF PRESENT ILLNESS:  76 y.o. male with past medical history of atrial fibrillation on systemic anticoagulation with Coumadin, OSA on CPAP therapy, chronic hypoxic respiratory failure on 4 L/m, chronic renal failure, CAD s/p CABG, DM, Dyslipidemia, HTN, Pulmonary HTN, and cor pulmonale. He presented to outside hospital with 3 days of progressive dyspnea. Patient aggressively diuresed with Bumex drip and Primacor drip. With continued worsening in clinical status as well as worsening renal function, hypercapnia, and respiratory acidosis patient was transferred to [facility] for further medical management. PCCM asked to consult for respiratory failure.  SUBJECTIVE:  Patient suffered PEA arrest this morning. Family updated via phone & en route.   REVIEW OF SYSTEMS:  Unable to obtain given intubated status.  VITAL SIGNS: BP (!) 137/48   Pulse 88   Temp 98.1 F (36.7 C)   Resp (!) 29   Ht 5\' 10"  (1.778 m)   Wt (!) 321 lb (145.6 kg)   SpO2 100%   BMI 46.06 kg/m   HEMODYNAMICS: CVP:  [0 mmHg-18 mmHg] 17 mmHg  VENTILATOR SETTINGS: Vent Mode: PRVC FiO2 (%):  [50 %-100 %] 80 % Set Rate:  [28 bmp] 28 bmp Vt Set:  [580 mL] 580 mL PEEP:  [10 cmH20-14 cmH20] 10 cmH20 Plateau Pressure:  [26 cmH20-30 cmH20] 30 cmH20  INTAKE / OUTPUT: I/O last 3 completed shifts: In: 3671.1 [I.V.:2431.1; NG/GT:1240] Out: 7003 [Urine:399; Other:6604]  PHYSICAL EXAMINATION: General:  No acute distress. No family at bedside.  Integument:  Warm & dry. No rash on exposed skin.  Extremities:  No cyanosis.  HEENT:  Moist mucus membranes. Scleral edema present. Endotracheal tube in place.  Cardiovascular:  Regular rate. Unable to appreciate JVD. Sinus on telemetry. Pulmonary:   Distant breath sounds. Symmetric chest rise on ventilator. Abdomen: Soft. Hypoactive bowel sounds. Protuberant. Neurological: Pupils symmetric and reactive. No spontaneous movements.  LABS:  BMET  Recent Labs Lab 04/21/17 0330 04/21/17 1532 Apr 23, 2017 0300  NA 135 132* 134*  K 4.6 4.4 4.2  CL 101 102 100*  CO2 24 25 25   BUN 44* 42* 38*  CREATININE 2.08* 1.89* 1.72*  GLUCOSE 150* 169* 128*    Electrolytes  Recent Labs Lab 04/20/17 0406  04/21/17 0330 04/21/17 1532 04/23/2017 0300  CALCIUM  --   < > 7.9* 7.7* 8.0*  MG 2.3  --  2.4  --  2.7*  PHOS  --   < > 4.4 3.5 3.1  < > = values in this interval not displayed.  CBC  Recent Labs Lab 04/20/17 0406 04/21/17 0330 04/23/2017 0300  WBC 9.4 10.9* 11.4*  HGB 10.4* 11.1* 10.7*  HCT 32.0* 34.5* 32.9*  PLT 123* 124* 147*    Coag's  Recent Labs Lab 04/13/2017 2322  04/21/17 1332 04/21/17 2000 April 23, 2017 0300  APTT 45*  --   --   --   --   INR 2.98  < > 2.38 2.11 1.91  < > = values in this interval not displayed.  Sepsis Markers  Recent Labs Lab 04/30/2017 2322 04/18/17 0319 04/18/17 1026  LATICACIDVEN 0.8 1.0 0.55    ABG  Recent Labs Lab 04/19/17 0408 04/20/17 0419 2017-04-23 0506  PHART 7.396 7.465* 7.294*  PCO2ART 37.7 36.0 50.8*  PO2ART 67.0* 64.0*  58.1*    Liver Enzymes  Recent Labs Lab 04-27-2017 2322  04/21/17 0330 04/21/17 1532 04/26/2017 0300  AST 16  --   --   --   --   ALT 11*  --   --   --   --   ALKPHOS 84  --   --   --   --   BILITOT 0.9  --   --   --   --   ALBUMIN 3.4*  < > 2.8* 2.7* 2.7*  < > = values in this interval not displayed.  Cardiac Enzymes  Recent Labs Lab 04/18/17 0451 04/18/17 0638  TROPONINI 0.05* 0.06*    Glucose  Recent Labs Lab 04/21/17 1201 04/21/17 1541 04/21/17 2005 04/21/17 2345 04/21/2017 0345 05/01/2017 0832  GLUCAP 142* 167* 141* 145* 122* 143*    Imaging Dg Chest Port 1 View  Result Date: 05/03/2017 CLINICAL DATA:  Check endotracheal tube  placement EXAM: PORTABLE CHEST 1 VIEW COMPARISON:  04/19/2017 FINDINGS: Cardiac shadow remains enlarged. Endotracheal tube, left jugular central line and nasogastric catheter are again seen and stable. The lungs are well aerated bilaterally. Increasing vascular congestion is noted. Some slight worsening in the degree of pulmonary edema is noted. No focal confluent infiltrate is seen. IMPRESSION: Slight increase in degree of CHF when compared with the prior exam. Electronically Signed   By: Alcide Clever M.D.   On: 05/03/2017 07:38     STUDIES:  PORT CXR 8/16:  Previously reviewed by me. Endotracheal tube in goodpositon. Enteric feeding tube coursing below the diaphragm. Bilateral hilar fullness. Elevation of left hemidiaphragm. Pulmonary arterial catheter appears slightly coiled. RENAL U/S 8/16:  Unable to visualize left kidney due to overlying gas and limitation in patient motion or ability to cooperate.  Cyst arising from upper pole right kidney. Right kidney otherwise appears unremarkable. Urinary bladder decompressed and unable to assess. VENOUS DUPLEX LLE 8/16:  No DVT or SVT. TTE 8/16:  LV moderately dilated with EF 55-60%. Cannot exclude wall motion abnormality although none seen. Insufficient to assess diastolic function. LA moderately dilated & RA severely dilated. RV not well visualized but overall severely dilated with severely reduced systolic function. Pulmonary artery systolic pressure 61 mmHg. Aortic valve poorly visualized but no stenosis or regurgitation seen. Aortic root normal in size. No mitral stenosis or regurgitation. Paradoxical septal motion. Mild pulmonic regurgitation with poorly visualized valve. Moderate tricuspid regurgitation. No pericardial effusion. PORT CXR 8/17: Enteric feeding tube coursing below diaphragm. Endotracheal tube in acceptable position. Left internal jugular central venous catheter in good position. Right central venous catheter removed. I have reviewed the  images personally PORT CXR 8/20:  Personally reviewed by me. Slight improvement in bilateral hilar fullness. Endotracheal tube and left internal jugular central venous catheter in good position. Enteric feeding tube coursing below diaphragm.  MICROBIOLOGY: MRSA PCR 8/15:  Negative   ANTIBIOTICS: None.   SIGNIFICANT EVENTS: 8/15 - Transferred from [facility] & intubated w/ worsening respiratory status & encephalopathy 8/16 - PA catheter placed & then discontinued w/ PCWP 14. Started on CVVHD. 8/17 - Pulling 100-150cc on CVVHD & off pressor. 8/20 - PEA arrest w/ rapid ROSC & then V tach w/ cardioversion x3 >> started on Amiodarone & Lidocaine drips  LINES/TUBES: R IJ PA CATHETER 8/16 (out same day) OETT 8/16 >>> L IJ TEMP HD CATHETER 8/16 >>> L RADIAL ART LINE >>> OGT 8/16 >>> FOLEY 8/16 >>> PIV  ASSESSMENT / PLAN:  PULMONARY A: Acute on chronic hypoxic respiratory  failure: Multifactorial in the setting of pulmonary edema and pulmonary hypertension. Uses 4 L/m at home. Acute hypercarbic respiratory failure: Question possible underlying COPD. Pulmonary hypertension: Multifactorial. OSA: On CPAP therapy at home. Possible COPD: Chronic tobacco use. Tobacco use disorder: Quit smoking tobacco 7 months prior to admission.  P:   Continuing full ventilator support Cautiously weaning FiO2 Diuresis as below Duonebs every 6 hours Continuing Revatio Family report patient would not want tracheostomy   CARDIOVASCULAR A:  PEA Arrest:  8/20 w/ rapid ROSC. V Tach:  8/20 after PEA arrest. Cardioversion x3. Acute on chronic congestive heart failure: History of diastolic congestive heart failure.  Cor pulmonale: Question acute versus chronic. H/O CAD s/p CABG, HTN, HLD, paroxysmal atrial fibrillation   P:  Management per cardiology Continuing amiodarone and lidocaine infusions Weaning Levophed for MAP >65 Continuous telemetry monitoring Vitals per unit protocol  RENAL A:   Acute on  chronic renal failure stage IV: Multifactorial. Known history of urinary retention. Hyperkalemia: Resolved. History of urinary retention  Metabolic acidosis: Resolved. Hyponatremia:  Mild.   P:   Continuing to monitor urine output Lasix IV every 6 hours Renal replacement therapy per nephrology Monitoring electrolytes and renal function twice daily  GASTROINTESTINAL A:   No acute issues.   P:   Nothing by mouth   HEMATOLOGIC A:   Anemia: No signs of active bleeding. Likely secondary to chronic disease. Leukocytosis: Mild. Likely multifactorial in etiology. Coagulopathy: Secondary to chronic anticoagulation with Coumadin.  Thrombocytopenia: Mild.  P:  Trending cell counts daily with CBC while on systemic anticoagulation  INFECTIOUS A:   No acute issues.  P:   Monitoring for signs/symptoms of infection  ENDOCRINE A:   DM:  Glucose controlled.  P:   Accu-Cheks every 4 hours Sliding scale insulin per standard algorithm  NEUROLOGIC A:   Acute encephalopathy: Likely multifactorial from hypoxia and hypercarbia. Sedation on ventilator   P:   RASS goal: 0 to -1 Intermittent sedation  Prophylaxis:  Heparin drip per protocol & Protonix via tube daily. Diet: NPO. Holding tube feedings Code Status:  Full code per previous physician discussions. Disposition:  Prognosis remains guarded. Patient to remain in ICU. Family Update: No family at bedside at the time of my exam.  DISCUSSION:  76 y.o. male with pulmonary hypertension and decompensated cor pulmonale. Currently on renal replacement therapy. Status post arrest earlier this morning. Family in route to discuss further goals of care.   I have spent a total of 32 minutes of critical care time today caring for the patient and reviewing the patient's electronic medical record.   Remainder of care as per primary service and other consultants.  Donna Christen Jamison Neighbor, M.D. [facility] Pulmonary & Critical Care Pager:   [phone] After 3pm or if no response, call [phone] 04/23/2017, 10:25 AM

## 2017-05-04 NOTE — Progress Notes (Signed)
ANTICOAGULATION CONSULT NOTE - Follow Up Consult  Pharmacy Consult for Heparin  Indication: atrial fibrillation  Allergies  Allergen Reactions  . Oxycontin [Oxycodone Hcl] Shortness Of Breath, Nausea And Vomiting and Other (See Comments)    Reported by patient    Patient Measurements: Height: 5\' 10"  (177.8 cm) Weight: (!) 321 lb (145.6 kg) IBW/kg (Calculated) : 73  Vital Signs: Temp: 98.1 F (36.7 C) (08/20 1252) BP: 137/48 (08/20 0817) Pulse Rate: 88 (08/20 1100)  Labs:  Recent Labs  04/20/17 0406  04/21/17 0330 04/21/17 1332 04/21/17 1532 04/21/17 2000 04/05/2017 0300 04/14/2017 1200  HGB 10.4*  --  11.1*  --   --   --  10.7*  --   HCT 32.0*  --  34.5*  --   --   --  32.9*  --   PLT 123*  --  124*  --   --   --  147*  --   LABPROT 31.6*  --  28.4* 26.4*  --  24.0* 22.1*  --   INR 2.98  --  2.61 2.38  --  2.11 1.91  --   HEPARINUNFRC  --   --   --   --   --   --  0.27* 0.34  CREATININE  --   < > 2.08*  --  1.89*  --  1.72*  --   < > = values in this interval not displayed.  Estimated Creatinine Clearance: 54.4 mL/min (A) (by C-G formula based on SCr of 1.72 mg/dL (H)).   Assessment: 76 y/o M on heparin for afib now that INR is <2. Initial heparin level this AM is just below therapeutic range.  Heparin drip increased 1600 uts/hr HL a goal 0.34, CBC stable. S/p PEA arrest last pm then VT arrest and shocked now in SR 80s  Goal of Therapy:  Heparin level 0.3-0.7 units/ml Monitor platelets by anticoagulation protocol: Yes   Plan:  Continue heparin drip 1600 uts/hr  Daily HL, CBC  Leota Sauers Pharm.D. CPP, BCPS Clinical Pharmacist [phone] 04/26/2017 3:18 PM

## 2017-05-04 NOTE — Progress Notes (Signed)
Subjective: Interval History: on vent sedated, bp stable Objective: Vital signs in last 24 hours: Temp:  [97.6 F (36.4 C)-98 F (36.7 C)] 97.6 F (36.4 C) (08/20 0310) Pulse Rate:  [75-125] 88 (08/20 0645) Resp:  [20-36] 29 (08/20 0645) BP: (110)/(52) 110/52 (08/19 1515) SpO2:  [91 %-100 %] 99 % (08/20 0645) Arterial Line BP: (92-142)/(7-61) 120/53 (08/20 0645) FiO2 (%):  [50 %-100 %] 100 % (08/20 0630) Weight:  [145.6 kg (321 lb)] 145.6 kg (321 lb) (08/20 0310) Weight change: -2.268 kg (-5 lb)  Intake/Output from previous day: 08/19 0701 - 08/20 0700 In: 2645.5 [I.V.:1845.5; NG/GT:800] Out: 4848 [Urine:190] Intake/Output this shift: No intake/output data recorded.  General appearance: moderately obese and not responsive, on vent Neck: LIJ cath Resp: diminished breath sounds bilaterally, rhonchi bilaterally and wheezes bilaterally Cardio: regular rate and rhythm and systolic murmur: holosystolic 2/6, blowing at apex GI: obese, pos bs, liver down 6 cm Extremities: edema 3+  Lab Results:  Recent Labs  04/21/17 0330 04/13/2017 0300  WBC 10.9* 11.4*  HGB 11.1* 10.7*  HCT 34.5* 32.9*  PLT 124* 147*   BMET:  Recent Labs  04/21/17 1532 04/04/2017 0300  NA 132* 134*  K 4.4 4.2  CL 102 100*  CO2 25 25  GLUCOSE 169* 128*  BUN 42* 38*  CREATININE 1.89* 1.72*  CALCIUM 7.7* 8.0*   No results for input(s): PTH in the last 72 hours. Iron Studies: No results for input(s): IRON, TIBC, TRANSFERRIN, FERRITIN in the last 72 hours.  Studies/Results: Dg Chest Port 1 View  Result Date: 04/14/2017 CLINICAL DATA:  Check endotracheal tube placement EXAM: PORTABLE CHEST 1 VIEW COMPARISON:  04/19/2017 FINDINGS: Cardiac shadow remains enlarged. Endotracheal tube, left jugular central line and nasogastric catheter are again seen and stable. The lungs are well aerated bilaterally. Increasing vascular congestion is noted. Some slight worsening in the degree of pulmonary edema is noted. No  focal confluent infiltrate is seen. IMPRESSION: Slight increase in degree of CHF when compared with the prior exam. Electronically Signed   By: Alcide Clever M.D.   On: 04/24/2017 07:38    I have reviewed the patient's current medications.  Assessment/Plan: 1 AKI/CKD good control of acid/base/solute. Vol xs, ^ net neg as bp tol.  Will also start Furosemide 2 Anemia 3 CM 4 COPD  5 Acute resp failure per CCM 6 Obesity 7 Nutrition TF P CRRT, ^ net neg, Lasix.  TF    LOS: 5 days   Placido Hangartner L 04/05/2017,7:47 AM

## 2017-05-04 DEATH — deceased

## 2018-04-15 IMAGING — DX DG CHEST 1V PORT
1 series · 1 of 1 positions shown · non-contrast
Comparison: None.

CLINICAL DATA: Acute onset of respiratory failure. Hypoxia. Initial
encounter.

EXAM:
PORTABLE CHEST 1 VIEW

[chest ap]
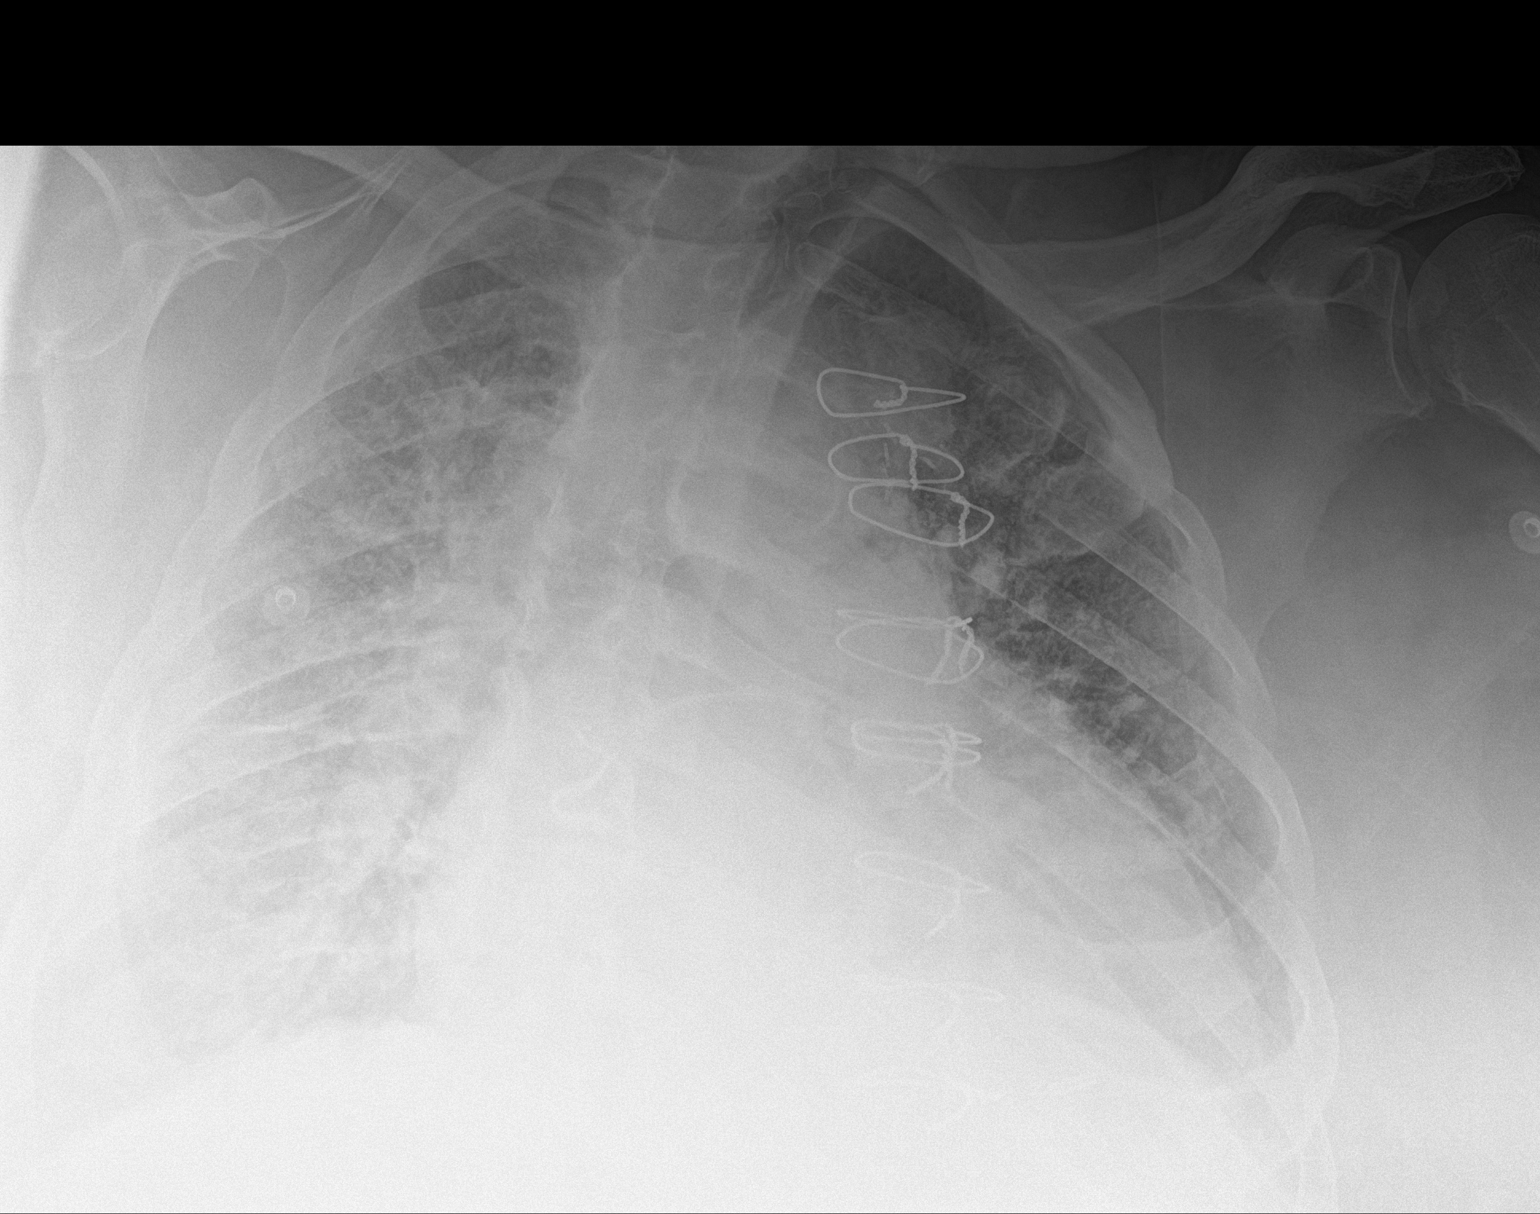

[1 of 1 positions shown; findings below may reference images not displayed]

FINDINGS: The lungs are well-aerated. Vascular congestion is noted. Diffusely
increased interstitial markings raise concern for pulmonary edema. A
small right pleural effusion is suspected. No pneumothorax is seen.

The cardiomediastinal silhouette is mildly enlarged. The patient is
status post median sternotomy. Evaluation is somewhat suboptimal due
to patient rotation. No acute osseous abnormalities are seen.
IMPRESSION: Vascular congestion and mild cardiomegaly. Suspect small right
pleural effusion. Diffusely increased interstitial markings raise
concern for pulmonary edema.

## 2018-04-16 IMAGING — DX DG CHEST 1V PORT
1 series · 1 of 1 positions shown · non-contrast
Comparison: 04/18/2017

ADDENDUM:
Impression should read: No pneumothorax following central venous
catheter placement
CLINICAL DATA: Check endotracheal tube placement and central venous
line placement

EXAM:
PORTABLE CHEST 1 VIEW

[chest ap]
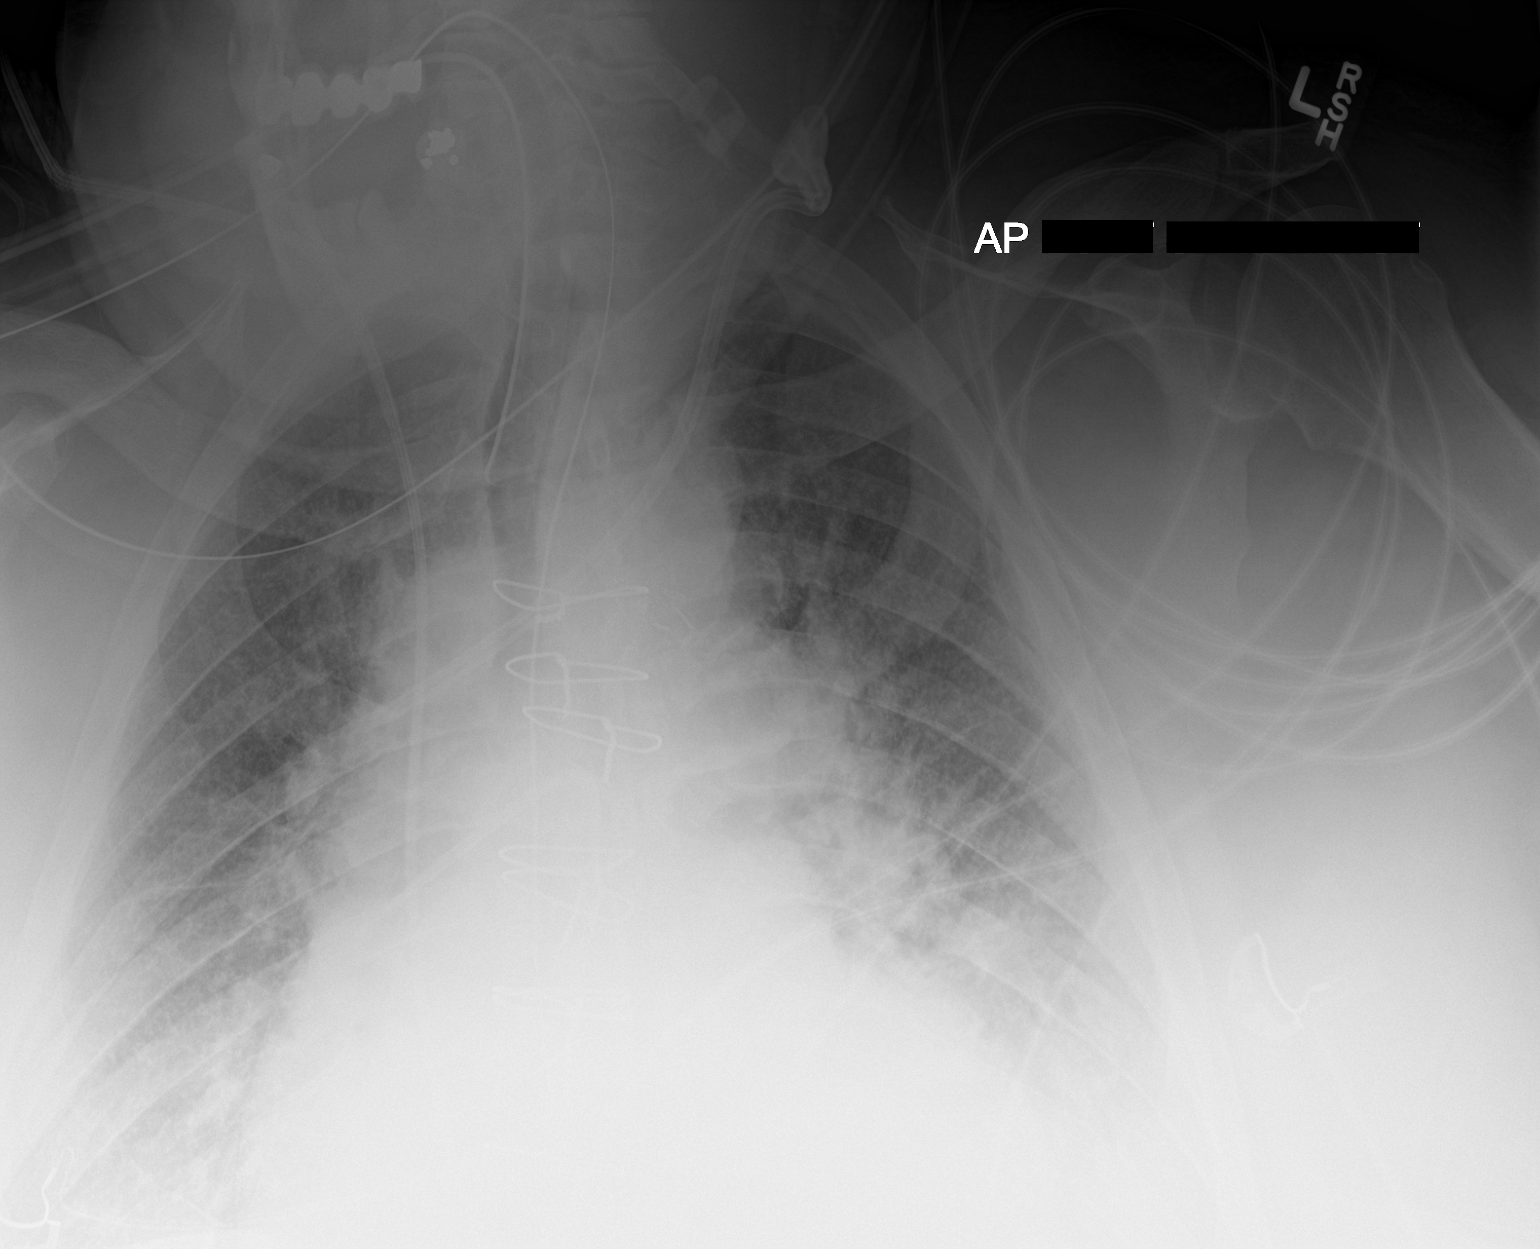

[1 of 1 positions shown; findings below may reference images not displayed]

FINDINGS: New left central venous line is noted with catheter tip in the
proximal superior vena cava. Swan-Ganz catheter is noted in the
right pulmonary outflow tract. An endotracheal tube and nasogastric
catheter are in satisfactory position. No pneumothorax is noted.
Vascular congestion is again noted and increasing.
IMPRESSION: Pneumothorax following central venous catheter placement.

Increase in the degree of congestive failure.
